# Patient Record
Sex: Female | Born: 1980 | Race: White | Hispanic: No | Smoking: Current every day smoker
Health system: Southern US, Community
[De-identification: ages and names within clinical notes are randomized; demographics above are authoritative.]

## PROBLEM LIST (undated history)

## (undated) DIAGNOSIS — F32A Depression, unspecified: Secondary | ICD-10-CM

## (undated) DIAGNOSIS — G43909 Migraine, unspecified, not intractable, without status migrainosus: Secondary | ICD-10-CM

## (undated) DIAGNOSIS — M26629 Arthralgia of temporomandibular joint, unspecified side: Secondary | ICD-10-CM

## (undated) DIAGNOSIS — Z973 Presence of spectacles and contact lenses: Secondary | ICD-10-CM

## (undated) DIAGNOSIS — M1990 Unspecified osteoarthritis, unspecified site: Secondary | ICD-10-CM

## (undated) DIAGNOSIS — F329 Major depressive disorder, single episode, unspecified: Secondary | ICD-10-CM

## (undated) DIAGNOSIS — R059 Cough, unspecified: Secondary | ICD-10-CM

## (undated) DIAGNOSIS — F419 Anxiety disorder, unspecified: Secondary | ICD-10-CM

## (undated) HISTORY — DX: Major depressive disorder, single episode, unspecified: F32.9

## (undated) HISTORY — DX: Depression, unspecified: F32.A

## (undated) HISTORY — DX: Anxiety disorder, unspecified: F41.9

## (undated) HISTORY — PX: ABDOMINAL HYSTERECTOMY: SHX81

## (undated) HISTORY — PX: TONSILLECTOMY: SUR1361

---

## 2013-03-19 LAB — HM PAP SMEAR: HM Pap smear: NEGATIVE

## 2013-05-07 LAB — HM HIV SCREENING LAB: HM HIV Screening: NEGATIVE

## 2018-04-23 ENCOUNTER — Encounter: Payer: Self-pay | Admitting: Emergency Medicine

## 2018-04-23 ENCOUNTER — Emergency Department: Payer: Medicaid - Out of State

## 2018-04-23 ENCOUNTER — Emergency Department
Admission: EM | Admit: 2018-04-23 | Discharge: 2018-04-23 | Disposition: A | Discharge status: Home / Self Care | Payer: Medicaid - Out of State | Attending: Emergency Medicine | Admitting: Emergency Medicine

## 2018-04-23 DIAGNOSIS — Y929 Unspecified place or not applicable: Secondary | ICD-10-CM | POA: Insufficient documentation

## 2018-04-23 DIAGNOSIS — Y999 Unspecified external cause status: Secondary | ICD-10-CM | POA: Diagnosis not present

## 2018-04-23 DIAGNOSIS — Y939 Activity, unspecified: Secondary | ICD-10-CM | POA: Diagnosis not present

## 2018-04-23 DIAGNOSIS — M5124 Other intervertebral disc displacement, thoracic region: Secondary | ICD-10-CM

## 2018-04-23 DIAGNOSIS — S29012A Strain of muscle and tendon of back wall of thorax, initial encounter: Secondary | ICD-10-CM | POA: Insufficient documentation

## 2018-04-23 DIAGNOSIS — M546 Pain in thoracic spine: Secondary | ICD-10-CM | POA: Diagnosis present

## 2018-04-23 DIAGNOSIS — X509XXA Other and unspecified overexertion or strenuous movements or postures, initial encounter: Secondary | ICD-10-CM | POA: Insufficient documentation

## 2018-04-23 DIAGNOSIS — S29019A Strain of muscle and tendon of unspecified wall of thorax, initial encounter: Secondary | ICD-10-CM

## 2018-04-23 DIAGNOSIS — M5414 Radiculopathy, thoracic region: Secondary | ICD-10-CM | POA: Diagnosis not present

## 2018-04-23 MED ORDER — PREDNISONE 20 MG PO TABS
60.0000 mg | ORAL_TABLET | Freq: Once | ORAL | Status: AC
  Administered 2018-04-23: 60 mg via ORAL
  Filled 2018-04-23: qty 3

## 2018-04-23 MED ORDER — PREDNISONE 10 MG (21) PO TBPK: ORAL_TABLET | ORAL | 0 refills | Status: DC

## 2018-04-23 MED ORDER — DIAZEPAM 2 MG PO TABS
2.0000 mg | ORAL_TABLET | Freq: Once | ORAL | Status: AC
  Administered 2018-04-23: 2 mg via ORAL
  Filled 2018-04-23: qty 1

## 2018-04-23 MED ORDER — TRAMADOL HCL 50 MG PO TABS
50.0000 mg | ORAL_TABLET | Freq: Once | ORAL | Status: AC
  Administered 2018-04-23: 50 mg via ORAL
  Filled 2018-04-23: qty 1

## 2018-04-23 MED ORDER — METHOCARBAMOL 500 MG PO TABS: 500.0000 mg | ORAL_TABLET | Freq: Four times a day (QID) | ORAL | 0 refills | Status: AC

## 2018-04-23 MED ORDER — TRAMADOL HCL 50 MG PO TABS: 50.0000 mg | ORAL_TABLET | Freq: Three times a day (TID) | ORAL | 0 refills | Status: AC | PRN

## 2018-04-23 NOTE — ED Provider Notes (Signed)
[facility] Emergency Department Provider Note  ____________________________________________  Time seen: Approximately 2:14 PM  I have reviewed the triage vital signs and the nursing notes.   HISTORY  Chief Complaint Back Pain    HPI Veronica Lyons is a 37 y.o. female that presents emergency department for evaluation of mid back pain for 2 weeks.  Patient states that she was carrying some boxes and twisted to the left as a 2-hour ran to her legs and immediately felt pain in her mid back.  She states that pain starts in the middle and radiates to both sides.  She feels like a band is squeezing her back. Pain is worse when she lifts her arms. She has gone to the chiropractor 3 times since incident.  Yesterday the chiropractor was not able to do anything because of her pain.  No IV drug use.  No connective tissue disorders.  The pain does not radiate into her arms.  No numbness or tingling in her hands.  No fever, chills, headache, neck pain, low back pain.  History reviewed. No pertinent past medical history.  There are no active problems to display for this patient.   History reviewed. No pertinent surgical history.  Prior to Admission medications   Not on File    Allergies Patient has no known allergies.  No family history on file.  Social History Social History   Tobacco Use  . Smoking status: Not on file  Substance Use Topics  . Alcohol use: Not on file  . Drug use: Not on file     Review of Systems  Constitutional: No fever/chills ENT: No upper respiratory complaints. Cardiovascular: No chest pain. Respiratory: No SOB. Gastrointestinal: No nausea, no vomiting.  Musculoskeletal: Positive for back pain.  Skin: Negative for rash, abrasions, lacerations, ecchymosis. Neurological: Negative for headaches, numbness or tingling   ____________________________________________   PHYSICAL EXAM:  VITAL SIGNS: ED Triage Vitals  Enc Vitals  Group     BP 04/23/18 1356 112/74     Pulse Rate 04/23/18 1356 93     Resp 04/23/18 1356 16     Temp 04/23/18 1356 97.7 F (36.5 C)     Temp Source 04/23/18 1356 Oral     SpO2 04/23/18 1356 100 %     Weight 04/23/18 1350 130 lb (59 kg)     Height 04/23/18 1350 5\' 4"  (1.626 m)     Head Circumference --      Peak Flow --      Pain Score 04/23/18 1350 8     Pain Loc --      Pain Edu? --      Excl. in GC? --      Constitutional: Alert and oriented. Well appearing and in no acute distress. Eyes: Conjunctivae are normal. PERRL. EOMI. Head: Atraumatic. ENT:      Ears:      Nose: No congestion/rhinnorhea.      Mouth/Throat: Mucous membranes are moist.  Neck: No stridor.  Cardiovascular: Normal rate, regular rhythm.  Good peripheral circulation. Respiratory: Normal respiratory effort without tachypnea or retractions. Lungs CTAB. Good air entry to the bases with no decreased or absent breath sounds. Musculoskeletal: Full range of motion to all extremities. No gross deformities appreciated. Positive for back pain. Tenderness to palpation over inferior thoracic spine and thoracic paraspinal muscles. Pain elicited with abduction of bilateral arms. Neurologic:  Normal speech and language. No gross focal neurologic deficits are appreciated.  Skin:  Skin is warm, dry  and intact. No rash noted. Psychiatric: Mood and affect are normal. Speech and behavior are normal. Patient exhibits appropriate insight and judgement.   ____________________________________________   LABS (all labs ordered are listed, but only abnormal results are displayed)  Labs Reviewed - No data to display ____________________________________________  EKG   ____________________________________________  RADIOLOGY   No results found.  ____________________________________________    PROCEDURES  Procedure(s) performed:    Procedures    Medications  diazepam (VALIUM) tablet 2 mg (2 mg Oral Given  04/23/18 1510)     ____________________________________________   INITIAL IMPRESSION / ASSESSMENT AND PLAN / ED COURSE  Pertinent labs & imaging results that were available during my care of the patient were reviewed by me and considered in my medical decision making (see chart for details).  Review of the [place] CSRS was performed in accordance of the [facility] prior to dispensing any controlled drugs.   Patient presented to the emergency department for evaluation of mid back pain for 2 weeks.  Pain is likely caused by muscle spasms.  Case was discussed with Dr. Sharma Covert.  MRI was ordered given duration of symptoms.  Patient will be transferred to [facility] for care with plan of discharge and muscle relaxers.    ____________________________________________  FINAL CLINICAL IMPRESSION(S) / ED DIAGNOSES  Final diagnoses:  None      NEW MEDICATIONS STARTED DURING THIS VISIT:  ED Discharge Orders    None          This chart was dictated using voice recognition software/Dragon. Despite best efforts to proofread, errors can occur which can change the meaning. Any change was purely unintentional.    Enid Derry, PA-C 04/23/18 1532    Rockne Menghini, MD 04/23/18 1534

## 2018-04-23 NOTE — ED Triage Notes (Signed)
Pt reports 2 weeks ago was lifting heavy boxes and pulled a muscle in her upper back.

## 2018-04-23 NOTE — Discharge Instructions (Signed)
Your exam and MRI of the thoracic spine shows muscle spasms and some radiculopathy chest wall pain. Take the prescription meds as directed. Follow-up with ortho or the urgent care clinic, as needed.

## 2018-04-23 NOTE — ED Provider Notes (Signed)
-----------------------------------------   6:37 PM on 04/23/2018 -----------------------------------------  Blood pressure 120/70, pulse 80, temperature 97.7 F (36.5 C), temperature source Oral, resp. rate 16, height 5\' 4"  (1.626 m), weight 59 kg, SpO2 100 %.  Assuming care from Enid Derry, PA-C.  In short, Veronica Lyons is a 38 y.o. female with a chief complaint of Back Pain .  Refer to the original H&P for additional details.  The current plan of care is to await thoracic MRI results and disposition accordingly. ___________________________________________________________________  RADIOLOGY  Thoracic Spine MRI  IMPRESSION: Small right paracentral disc protrusions at T7-8 and T8-9 mildly indenting the cord. _________________________________________________________________  PROCEDURES  Valium 2 mg PO Prednisone 60 mg PO Tramadol 50 mg PO _________________________________________________________________  DISPOSITION   Patient with ED evaluation of a 2-week complaint of chest wall pain s/p mechanical injury. She noted increased muscle spasms and referral of pain to the left anterior chest wall after visiting a chiropractor. She has right paracentral disc protrusions at T7-8 and T8-9, consistent with her radicular symptoms. She is discharged with prescriptions for Tramadol, prednisone, and Robaxin. She is referred to ortho for further management.      Lissa Hoard, PA-C 04/23/18 1909    Willy Eddy, MD 04/23/18 [phone]

## 2018-04-23 NOTE — ED Notes (Signed)
Se triage note  States she twisted her back about 2 weeks ago  States she was lifting heavy boxes up the stairs  And a dog went between her legs    States pain is mainly in mid back  Has been using aleve.ice and going to chiropractor  States pain became more intense yesterday

## 2018-05-11 ENCOUNTER — Emergency Department: Payer: Medicaid - Out of State

## 2018-05-11 ENCOUNTER — Other Ambulatory Visit: Payer: Self-pay

## 2018-05-11 ENCOUNTER — Emergency Department
Admission: EM | Admit: 2018-05-11 | Discharge: 2018-05-11 | Disposition: A | Discharge status: Home / Self Care | Payer: Medicaid - Out of State | Attending: Emergency Medicine | Admitting: Emergency Medicine

## 2018-05-11 DIAGNOSIS — F17200 Nicotine dependence, unspecified, uncomplicated: Secondary | ICD-10-CM | POA: Insufficient documentation

## 2018-05-11 DIAGNOSIS — M546 Pain in thoracic spine: Secondary | ICD-10-CM | POA: Diagnosis not present

## 2018-05-11 LAB — BASIC METABOLIC PANEL
ANION GAP: 9 (ref 5–15)
BUN: 20 mg/dL (ref 6–20)
CHLORIDE: 107 mmol/L (ref 98–111)
CO2: 26 mmol/L (ref 22–32)
Calcium: 10.1 mg/dL (ref 8.9–10.3)
Creatinine, Ser: 0.95 mg/dL (ref 0.44–1.00)
GFR calc non Af Amer: >60 mL/min (ref >60)
Glucose, Bld: 102 mg/dL — ABNORMAL HIGH (ref 70–99)
POTASSIUM: 5.5 mmol/L — AB (ref 3.5–5.1)
Sodium: 142 mmol/L (ref 135–145)

## 2018-05-11 LAB — CBC WITH DIFFERENTIAL/PLATELET
ABS IMMATURE GRANULOCYTES: 0.04 10*3/uL (ref 0.00–0.07)
BASOS ABS: 0.1 10*3/uL (ref 0.0–0.1)
Basophils Relative: 1 %
Eosinophils Absolute: 0.1 10*3/uL (ref 0.0–0.5)
Eosinophils Relative: 1 %
HCT: 47 % — ABNORMAL HIGH (ref 36.0–46.0)
HEMOGLOBIN: 15.4 g/dL — AB (ref 12.0–15.0)
IMMATURE GRANULOCYTES: 0 %
LYMPHS ABS: 2.5 10*3/uL (ref 0.7–4.0)
LYMPHS PCT: 24 %
MCH: 27.8 pg (ref 26.0–34.0)
MCHC: 32.8 g/dL (ref 30.0–36.0)
MCV: 84.8 fL (ref 80.0–100.0)
Monocytes Absolute: 0.6 10*3/uL (ref 0.1–1.0)
Monocytes Relative: 5 %
NEUTROS PCT: 69 %
NRBC: 0 % (ref 0.0–0.2)
Neutro Abs: 7.2 10*3/uL (ref 1.7–7.7)
Platelets: 288 10*3/uL (ref 150–400)
RBC: 5.54 MIL/uL — ABNORMAL HIGH (ref 3.87–5.11)
RDW: 14.9 % (ref 11.5–15.5)
WBC: 10.4 10*3/uL (ref 4.0–10.5)

## 2018-05-11 LAB — TROPONIN I: Troponin I: <0.03 ng/mL (ref <0.03)

## 2018-05-11 MED ORDER — PREDNISONE 10 MG (48) PO TBPK: ORAL_TABLET | ORAL | 0 refills | Status: DC

## 2018-05-11 MED ORDER — DIAZEPAM 5 MG PO TABS
10.0000 mg | ORAL_TABLET | Freq: Once | ORAL | Status: AC
  Administered 2018-05-11: 10 mg via ORAL
  Filled 2018-05-11: qty 2

## 2018-05-11 MED ORDER — DIAZEPAM 5 MG PO TABS: 5.0000 mg | ORAL_TABLET | Freq: Three times a day (TID) | ORAL | 0 refills | Status: DC | PRN

## 2018-05-11 MED ORDER — TRAMADOL HCL 50 MG PO TABS
50.0000 mg | ORAL_TABLET | Freq: Once | ORAL | Status: AC
  Administered 2018-05-11: 50 mg via ORAL
  Filled 2018-05-11: qty 1

## 2018-05-11 NOTE — ED Triage Notes (Addendum)
In October had MRI that showed thoracic vertebrae (T7-T9) that are bulging but not herniated. Took a week of medication. Since then taking ibuprofen and aleeve but having worse pain back and tingling in hands. States pain is at bra line from back to front. Denies urinary incontinence. [place] up with specialist here. A&O, ambulatory.

## 2018-05-11 NOTE — ED Notes (Signed)
Pt c/o back pain states she was here in October and was dx with herniated discs. Pt was given prednisone and a muscle relaxer. Pt states while the meds lasted she still had pain but it took the edge off. Since finshing meds pain is worse pt states its hard for her to take a breath because of the pain

## 2018-05-11 NOTE — ED Provider Notes (Signed)
[facility] Emergency Department Provider Note       Time seen: ----------------------------------------- 3:37 PM on 05/11/2018 -----------------------------------------   I have reviewed the triage vital signs and the nursing notes.  HISTORY   Chief Complaint Back Pain    HPI Veronica Lyons is a 37 y.o. female with a history of severe dysmenorrhea, Asherman syndrome and previous hysterectomy who presents to the ED for back pain.  Patient states she had an MRI in October of this year that showed thoracic vertebrae that were bulging but not significantly herniated.  She took a week of medication but since then she is having worse back pain and tingling in her hands.  She states pain is in the bra line from the back to the front.  She denies IV drug abuse, fever or new injury.  History reviewed. No pertinent past medical history.  There are no active problems to display for this patient.   History reviewed. No pertinent surgical history.  Allergies Toradol [ketorolac tromethamine]  Social History Social History   Tobacco Use  . Smoking status: Current Every Day Smoker  Substance Use Topics  . Alcohol use: Not Currently  . Drug use: Not on file   Review of Systems Constitutional: Negative for fever. Cardiovascular: Positive for chest tightness Respiratory: Negative for shortness of breath. Gastrointestinal: Negative for abdominal pain, vomiting and diarrhea. Musculoskeletal: Positive for back pain Skin: Negative for rash. Neurological: Negative for headaches, positive for paresthesias of the fingertips  All systems negative/normal/unremarkable except as stated in the HPI  ____________________________________________   PHYSICAL EXAM:  VITAL SIGNS: ED Triage Vitals  Enc Vitals Group     BP 05/11/18 1353 105/74     Pulse Rate 05/11/18 1353 (!) 102     Resp 05/11/18 1353 18     Temp 05/11/18 1353 98.1 F (36.7 C)     Temp Source 05/11/18  1353 Oral     SpO2 05/11/18 1353 98 %     Weight 05/11/18 1353 130 lb (59 kg)     Height 05/11/18 1353 5\' 4"  (1.626 m)     Head Circumference --      Peak Flow --      Pain Score 05/11/18 1402 8     Pain Loc --      Pain Edu? --      Excl. in GC? --    Constitutional: Alert and oriented. Well appearing and in no distress. Cardiovascular: Normal rate, regular rhythm. No murmurs, rubs, or gallops. Respiratory: Normal respiratory effort without tachypnea nor retractions. Breath sounds are clear and equal bilaterally. No wheezes/rales/rhonchi. Musculoskeletal: Nontender with normal range of motion in extremities. No lower extremity tenderness nor edema.  Exquisite tenderness along the paraspinous musculatures of the thoracic spine Neurologic:  Normal speech and language. No gross focal neurologic deficits are appreciated.  Paresthesias are noted in the fingertips, normal strength but strength testing of the upper extremities elicits pain in the back Skin:  Skin is warm, dry and intact. No rash noted. Psychiatric: Mood and affect are normal. Speech and behavior are normal.  ____________________________________________  EKG: Interpreted by me.  Sinus rhythm rate 89 bpm, normal PR interval, normal QRS, normal QT  Labs Reviewed  CBC WITH DIFFERENTIAL/PLATELET - Abnormal; Notable for the following components:      Result Value   RBC 5.54 (*)    Hemoglobin 15.4 (*)    HCT 47.0 (*)    All other components within normal limits  BASIC METABOLIC  PANEL - Abnormal; Notable for the following components:   Potassium 5.5 (*)    Glucose, Bld 102 (*)    All other components within normal limits  TROPONIN I    ____________________________________________  ED COURSE:  As part of my medical decision making, I reviewed the following data within the electronic MEDICAL RECORD NUMBER History obtained from family if available, nursing notes, old chart and ekg, as well as notes from prior ED visits. Patient  presented for persistent back pain with some chest tightness, we will assess with imaging as indicated at this time.   Procedures ____________________________________________   RADIOLOGY Images were viewed by me  Chest x-ray IMPRESSION: No active cardiopulmonary disease.  ____________________________________________  DIFFERENTIAL DIAGNOSIS   Musculoskeletal pain, bulging disc, GERD, unstable angina, spasm  FINAL ASSESSMENT AND PLAN  Back pain   Plan: The patient had presented for persistent back pain. Patient's labs are grossly unremarkable although I am not sure why her potassium was slightly elevated. Patient's imaging was negative.  We will try a longer steroid taper as well as Valium as a muscle relaxant.  She will be referred to orthopedics for outpatient follow-up.   Ulice Dash, MD   Note: This note was generated in part or whole with voice recognition software. Voice recognition is usually quite accurate but there are transcription errors that can and very often do occur. I apologize for any typographical errors that were not detected and corrected.     Emily Filbert, MD 05/11/18 [phone]

## 2018-05-11 NOTE — ED Notes (Addendum)
Spoke to Dr. Fanny Bien about pt presentation, verbal order for EKG. NO blood work at this time.

## 2018-05-11 NOTE — ED Notes (Signed)
ED Provider at bedside. 

## 2018-08-01 DIAGNOSIS — M546 Pain in thoracic spine: Secondary | ICD-10-CM | POA: Diagnosis not present

## 2018-08-02 DIAGNOSIS — M546 Pain in thoracic spine: Secondary | ICD-10-CM | POA: Diagnosis not present

## 2018-08-14 DIAGNOSIS — M5414 Radiculopathy, thoracic region: Secondary | ICD-10-CM | POA: Diagnosis not present

## 2018-08-20 DIAGNOSIS — M5414 Radiculopathy, thoracic region: Secondary | ICD-10-CM | POA: Diagnosis not present

## 2018-09-10 ENCOUNTER — Other Ambulatory Visit: Payer: Self-pay

## 2018-09-10 ENCOUNTER — Ambulatory Visit (INDEPENDENT_AMBULATORY_CARE_PROVIDER_SITE_OTHER): Payer: Medicaid Other | Admitting: Family Medicine

## 2018-09-10 ENCOUNTER — Encounter: Payer: Self-pay | Admitting: Family Medicine

## 2018-09-10 VITALS — BP 115/81 | HR 83 | Temp 98.6°F | Resp 16 | Ht 64.0 in | Wt 138.0 lb

## 2018-09-10 DIAGNOSIS — M4722 Other spondylosis with radiculopathy, cervical region: Secondary | ICD-10-CM | POA: Diagnosis not present

## 2018-09-10 DIAGNOSIS — Z7689 Persons encountering health services in other specified circumstances: Secondary | ICD-10-CM

## 2018-09-10 DIAGNOSIS — F4310 Post-traumatic stress disorder, unspecified: Secondary | ICD-10-CM | POA: Insufficient documentation

## 2018-09-10 MED ORDER — VENLAFAXINE HCL 37.5 MG PO TABS: 37.5000 mg | ORAL_TABLET | Freq: Two times a day (BID) | ORAL | 2 refills | Status: DC

## 2018-09-10 NOTE — Patient Instructions (Addendum)
Thank you for coming to the office today.  Continue Gabapentin - I agree with adjusting dose, most likely approaching 200 to 300mg  3 times a day is reasonable.  Start new med - Venlafaxine (generic Effexor) - twice a day - 37.5mg  tablet - may take few weeks to full effect, can increase dose in future - would have to careful about interaction with tramadol at that point.  Also keep in mind that any mood medication can potentially rarely cause you to have thoughts of harming yourself - if this does happen and you do not feel safe on your medication then need to notify us or seek help immediately.  If thoughts of harming yourself or others, or any significant concern about your safety, please call for help immediately:  [facility] 24 Hour Help Line [phone] or [phone] - Suicide prevention hotline [phone]  - 911  ---------------------------------------------------   PSYCHIATRY / THERAPY-[place]  Internal Referral [facility] - [facility]) Address: [address], [place], [place] [address] Hours: 8:30AM-5PM Phone: [phone]   [facility] [facility] (All ages) [address] [place], [id number] Phone: [phone] (Option 1) [email or web address]  Also consider [facility] as an option -----------------------------------------------------------------------------  SELF REFERRAL - WALK IN OPTIONS  [facility]) [place] [address] [place], [place] [address] Phone: [phone]  [employer], available walk-in 9am-4pm M-F [address] [place], [place] [address] Hours: 9am - 4pm (M-F, walk in available) [phone]  ----------------------------------------------------------------- PSYCHOLOGY COUNSELING ONLY  [facility]  Delight Ovens, LCSW [address] [place], [place] [address] Main Line:  [phone]  [facility]   Address: [address] [place], [place], [place] [address] Hours: Open today  9AM-7PM Phone: [phone]  [facility] [address] [facility]  [facility] Address: [address], [place], [place] [address] Phone: [phone]   Please schedule a Follow-up Appointment to: Return in about 6 weeks (around 10/22/2018) for PTSD follow-up med adjust.  If you have any other questions or concerns, please feel free to call the office or send a message through MyChart. You may also schedule an earlier appointment if necessary.  Additionally, you may be receiving a survey about your experience at our office within a few days to 1 week by e-mail or mail. We value your feedback.  [name], DO [facility], [place]
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
## 2018-09-11 ENCOUNTER — Encounter: Payer: Self-pay | Admitting: Family Medicine

## 2018-09-11 DIAGNOSIS — M4722 Other spondylosis with radiculopathy, cervical region: Secondary | ICD-10-CM | POA: Insufficient documentation
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
## 2018-09-11 NOTE — Assessment & Plan Note (Addendum)
Followed by Corinna Lines - request record Prior injections Now improved on med management Tramadol and Gabapentin - advised likely would benefit from titrate up gabapentin and down tramadol, only PRN

## 2018-09-16 DIAGNOSIS — H52223 Regular astigmatism, bilateral: Secondary | ICD-10-CM | POA: Diagnosis not present

## 2018-09-16 DIAGNOSIS — H5213 Myopia, bilateral: Secondary | ICD-10-CM | POA: Diagnosis not present

## 2018-10-15 DIAGNOSIS — M545 Low back pain: Secondary | ICD-10-CM | POA: Diagnosis not present

## 2018-10-15 DIAGNOSIS — M5414 Radiculopathy, thoracic region: Secondary | ICD-10-CM | POA: Diagnosis not present

## 2018-10-22 ENCOUNTER — Encounter: Payer: Self-pay | Admitting: Family Medicine

## 2018-10-22 ENCOUNTER — Ambulatory Visit (INDEPENDENT_AMBULATORY_CARE_PROVIDER_SITE_OTHER): Payer: Medicaid Other | Admitting: Family Medicine

## 2018-10-22 ENCOUNTER — Other Ambulatory Visit: Payer: Self-pay

## 2018-10-22 DIAGNOSIS — F4310 Post-traumatic stress disorder, unspecified: Secondary | ICD-10-CM | POA: Diagnosis not present

## 2018-10-22 DIAGNOSIS — G4701 Insomnia due to medical condition: Secondary | ICD-10-CM

## 2018-10-22 MED ORDER — VENLAFAXINE HCL 75 MG PO TABS: 75.0000 mg | ORAL_TABLET | Freq: Two times a day (BID) | ORAL | 5 refills | Status: DC

## 2018-10-22 NOTE — Assessment & Plan Note (Signed)
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

## 2018-10-22 NOTE — Progress Notes (Signed)
Subjective:    Patient ID: Veronica Lyons, female    DOB: Sep 28, 1980, 38 y.o.   MRN: [id number]  Veronica Lyons is a 38 y.o. female presenting on 10/22/2018 for Post-Traumatic Stress Disorder  Virtual / Telehealth Encounter - Video Visit via [email or web address] The purpose of this virtual visit is to provide medical care while limiting exposure to the novel coronavirus (COVID19) for both patient and office staff.  Consent was obtained for remote visit:  Yes.   Answered questions that patient had about telehealth interaction:  Yes.   I discussed the limitations, risks, security and privacy concerns of performing an evaluation and management service by video/telephone. I also discussed with the patient that there may be a patient responsible charge related to this service. The patient expressed understanding and agreed to proceed.  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 - Interval update with significant improvement overall for both mood and anxiety on medication - Today patient reports no new concerns. She is pleased with progress so far and thinks we are on the right track to improving her symptoms. She feels overall greater than 50% improved now. She admits that she felt improved after about 2 weeks on the medication, and this was about same time onset of COVID19 pandemic worsening, and she thinks that if she was not on medicine her mood/anxiety would have been much worse and harder to handle. - She describes still some episodic spells, with some days worse mood compared to others, and some days with anxiety worse compared to others, but on average these are both improved, see PHQ GAD  score below for ROS Current medications - Continues to take Venlafaxine IR 37.5mg  BID -  she is tolerating well except some mild stomach upset with constipation and GI upset, now changed diet to Carnivore, and has not added fiber supplement or other - Also continues to take Gabapentin 300mg  BID now, from orthopedic for cervica/thoracic radiculitis, and helps her mood and sleep. Also per orthopedics taking Tramadol 50mg  very RARELY now only 1 pill every few days to weeks, only has 15 pills per rx, only uses if overactivity pain or strain - Admits some still persistent insomnia, now no problem with sleep onset, able to fall asleep, but endorses persistent issues with sleep maintenance overnight will wake up overnight at times difficulty falling back asleep, has tried OTC Benadryl PRN some relief, and tried Melatonin recently - She is doing well currently in her current relationship and with her family - Best friend and sister is back in [place], primary support system - Denies any further panic attacks, suicidal ideation, active dyspnea, chest pain, panic, sweating, nausea vomiting  Depression screen [facility] 2/9 10/22/2018 09/10/2018  Decreased Interest 1 1  Down, Depressed, Hopeless 1 3  PHQ - 2 Score 2 4  Altered sleeping 3 2  Tired, decreased energy 1 3  Change in appetite 1 3  Feeling bad or failure about yourself  2 3  Trouble concentrating 2 1  Moving slowly or fidgety/restless 1 3  Suicidal thoughts 0 3  PHQ-9 Score 12 22  Difficult doing work/chores Somewhat difficult Very difficult   GAD 7 : Generalized Anxiety Score 10/22/2018 09/10/2018  Nervous, Anxious, on  Edge 1 3  Control/stop worrying 1 3  Worry too much - different things 1 3  Trouble relaxing 1 3  Restless 1 3  Easily annoyed or irritable 2 3  Afraid - awful might happen 2 3  Total GAD 7 Score 9 21  Anxiety Difficulty Somewhat difficult Very difficult     Social History   Tobacco Use  . Smoking status: Current Every Day  Smoker  . Smokeless tobacco: Current User  Substance Use Topics  . Alcohol use: Yes  . Drug use: Never    Review of Systems Per HPI unless specifically indicated above     Objective:    There were no vitals taken for this visit.  Wt Readings from Last 3 Encounters:  09/10/18 138 lb (62.6 kg)  05/11/18 130 lb (59 kg)  04/23/18 130 lb (59 kg)    Physical Exam  Note examination was completely remotely via video observation objective data only  Gen - well-appearing, no acute distress or apparent pain, comfortable, pleasant HEENT - eyes appear clear without discharge or redness Heart/Lungs - cannot examine virtually - observed no evidence of coughing or labored breathing. 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 underlying Sleep maintenance insomnia primarily, not problem for sleep onset  See A&P Possibly inc dose venlafaxine will help improve sleep maintenance Also advised maybe ask ortho about a PRN dose of gabapentin if wake up at night, or higher PM dose Continue melatonin, behavioral techniques Follow-up - if not improved sooner within weeks - otherwise discuss again in 3 months      PTSD (post-traumatic stress disorder) - Primary    Significantly improved in past 6 weeks on Venlafaxine for chronic PTSD w/ comorbid depression/anxiety See prior note for background info Secondary sleep maintenance insomnia as well, underlying issue to mental health and may be MSK - Prior failed treatment years ago SSRI Sertraline, Wellbutrin - No local Psychiatry or therapist - Past history of suicidal ideation, prior attempt years ago - now seems improved, denies any active ideation  or intent currently  Plan: 1. Increase venlafaxine from 37.5mg  BID up to 75mg  BID - new rx sent, advised she could take PM dose slightly earlier to avoid impact on keeping her awake potentially - Continue her Gabapentin per Orthopedics - 300 BID - Keep limiting her tramadol to 50mg  PRN 1 per day only if needs, up to max 15 pills per rx per ortho, very rarely taking, unlikely to cause any interaction since very rarely ever takes  Previously, detailed handout given for self referral or office referral options for Psychiatry / mental health provider therapist - pending further review  Previously given, safety resources given contact # for history of suicidal  Follow-up 3 months PTSD, med adjust, GAD7/PHQ9 - sooner if needed      Relevant Medications   venlafaxine (EFFEXOR) 75 MG tablet        Meds ordered this encounter  Medications  . venlafaxine (EFFEXOR) 75 MG tablet    Sig: Take 1 tablet (75 mg total) by mouth 2 (two) times daily.    Dispense:  60 tablet    Refill:  5    Increased dose from 37.5mg  up to 75     Follow-up: - Return in 3 months for PTSD, Insomnia, Med adjust  Patient verbalizes understanding with the above medical recommendations including the limitation of remote medical advice.  Specific follow-up and call-back criteria were given for patient to follow-up or seek medical care more urgently if needed.  Total duration of direct patient care provided via video conference: 15 minutes   [name], DO [facility] 10/22/2018, 2:52 PM

## 2018-10-22 NOTE — Patient Instructions (Addendum)
AVS instructions given over phone. She does not have MyChart access.  Please schedule a Follow-up Appointment to: Return in about 3 months (around 01/21/2019) for PTSD, Insomnia, Med adjust.  [name], DO [facility]

## 2018-10-22 NOTE — Assessment & Plan Note (Signed)
Significantly improved in past 6 weeks on Venlafaxine for chronic PTSD w/ comorbid depression/anxiety See prior note for background info Secondary sleep maintenance insomnia as well, underlying issue to mental health and may be MSK - Prior failed treatment years ago SSRI Sertraline, Wellbutrin - No local Psychiatry or therapist - Past history of suicidal ideation, prior attempt years ago - now seems improved, denies any active ideation or intent currently  Plan: 1. Increase venlafaxine from 37.5mg  BID up to 75mg  BID - new rx sent, advised she could take PM dose slightly earlier to avoid impact on keeping her awake potentially - Continue her Gabapentin per Orthopedics - 300 BID - Keep limiting her tramadol to 50mg  PRN 1 per day only if needs, up to max 15 pills per rx per ortho, very rarely taking, unlikely to cause any interaction since very rarely ever takes  Previously, detailed handout given for self referral or office referral options for Psychiatry / mental health provider therapist - pending further review  Previously given, safety resources given contact # for history of suicidal  Follow-up 3 months PTSD, med adjust, GAD7/PHQ9 - sooner if needed

## 2018-11-11 ENCOUNTER — Telehealth: Payer: Self-pay | Admitting: Family Medicine

## 2018-11-11 NOTE — Telephone Encounter (Signed)
Pt. Called said that she was waking up at 1:00 in the morning was unable to go back to sleep (going on since last visit)  Pt call back # is  [phone]

## 2018-11-11 NOTE — Telephone Encounter (Signed)
Please contact patient to triage her symptoms further, it sounds like this is unchanged from last visit with me 10/22/18, now 3 weeks later - still have same issue with waking up overnight.  Last time we increased Venlafaxine from 37.5 up to 75mg  daily now. Also continued on her existing Gabapentin and very rare Tramadol.  Let me know if she has any other information or - if she has a request on how she would like to treat this problem.  If she wants to discuss other medication rx options - and is ready to make a change - sooner than waiting until July at her next apt, we can schedule her for a brief Virtual Telephone Visit to discuss these available med options.  [name], DO [facility] 11/11/2018, 4:44 PM

## 2018-11-12 ENCOUNTER — Encounter: Payer: Self-pay | Admitting: Family Medicine

## 2018-11-12 ENCOUNTER — Other Ambulatory Visit: Payer: Self-pay

## 2018-11-12 ENCOUNTER — Ambulatory Visit (INDEPENDENT_AMBULATORY_CARE_PROVIDER_SITE_OTHER): Payer: Medicaid Other | Admitting: Family Medicine

## 2018-11-12 DIAGNOSIS — F410 Panic disorder [episodic paroxysmal anxiety] without agoraphobia: Secondary | ICD-10-CM | POA: Diagnosis not present

## 2018-11-12 DIAGNOSIS — G4701 Insomnia due to medical condition: Secondary | ICD-10-CM | POA: Diagnosis not present

## 2018-11-12 DIAGNOSIS — F4310 Post-traumatic stress disorder, unspecified: Secondary | ICD-10-CM

## 2018-11-12 MED ORDER — ZOLPIDEM TARTRATE ER 6.25 MG PO TBCR: 6.2500 mg | EXTENDED_RELEASE_TABLET | Freq: Every evening | ORAL | 1 refills | Status: DC | PRN

## 2018-11-12 MED ORDER — BUSPIRONE HCL 5 MG PO TABS: 5.0000 mg | ORAL_TABLET | Freq: Two times a day (BID) | ORAL | 2 refills | Status: DC

## 2018-11-12 NOTE — Telephone Encounter (Signed)
No vm set up. 

## 2018-11-12 NOTE — Telephone Encounter (Signed)
As per patient she can fall a sleep but hard time staying sleep basically same Sxs as last visit she is not taking Tramadol but have sone other changes and taking gabapentin she would like to discuss other medication options and do not want to wait until July and want sooner appointment scheduled for virtual phone visit today in the afternoon.

## 2018-11-12 NOTE — Progress Notes (Signed)
Virtual Visit via Telephone The purpose of this virtual visit is to provide medical care while limiting exposure to the novel coronavirus (COVID19) for both patient and office staff.  Consent was obtained for phone visit:  Yes.   Answered questions that patient had about telehealth interaction:  Yes.   I discussed the limitations, risks, security and privacy concerns of performing an evaluation and management service by telephone. I also discussed with the patient that there may be a patient responsible charge related to this service. The patient expressed understanding and agreed to proceed.  Patient Location: Home Provider Location: [facility])  ---------------------------------------------------------------------- Chief Complaint  Patient presents with  . Post-Traumatic Stress Disorder    follow up    S: Reviewed CMA documentation. 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 - Interval update with improvement initially 1+ weeks on higher dose, and then now in past 1-2 week with worsening life stressors at home with family and life in general now during coronavirus pandemic quarantine, and affecting her symptoms triggering regular panic attacks during the day and waking up overnight with insomnia unable to maintain sleep and it is worsening her anxiety and panic next day. - Today patient reports requesting further medication assistance for sleep insomnia and for panic. She describes acute onset of panic with hairs standing up, breathing fast, cannot catch breath, prickly tingling feeling, nausea, lasting for few hours, only relief is going somewhere alone and resting and breathing through, and self  calming techniques, temporary relief but still some shaking symptoms even when breathing improves.  Currently taking Venlafaxine  BID with good results, but not significant enough to control symptoms currently due to recent changes in life stressors  - Previously Buspar in past  BID - ineffective took for 2 weeks - does not recall but interested to retry this medication Prior failed treatment years ago SSRI Sertraline, Wellbutrin, thinks she has taken Trazodone before Successful treatment in past - Ambien CR - used this in past in hospital, able to rest better. Did not endorse a hang over effect. - She is doing well currently in her current relationship and with her family - Best friend and sister is back in [place], primary support system  Denies any high risk travel to areas of current concern for COVID19. Denies any known or suspected exposure to person with or possibly with COVID19.   Past Medical History:  Diagnosis Date  . Anxiety   . Depression    Social History   Tobacco Use  . Smoking status: Current Every Day Smoker  . Smokeless tobacco: Current User  Substance Use Topics  . Alcohol use: Yes  . Drug use: Never    Current Outpatient Medications:  .  gabapentin (NEURONTIN) 100 MG capsule, Take 300 mg by mouth 2 (two) times daily. Per orthopedics, Disp: , Rfl:  .  venlafaxine (EFFEXOR) 75 MG tablet, Take 1 tablet (75 mg total) by mouth 2 (two) times daily., Disp: 60 tablet, Rfl: 5 .  busPIRone (BUSPAR) 5 MG tablet, Take 1 tablet (5 mg total) by mouth 2 (two) times daily. Increase up to 7.5mg  (1.5) pills twice a day as tolerated., Disp: 60 tablet, Rfl: 2 .  traMADol (ULTRAM) 50 MG tablet, Take 50 mg  by mouth daily as needed. Rarely take only PRN, limited supply only, Disp: , Rfl:  .  zolpidem (AMBIEN CR) 6.25 MG CR tablet, Take 1 tablet (6.25 mg total) by mouth at bedtime as needed for sleep., Disp: 30 tablet, Rfl: 1  Depression screen [facility] 2/9 11/12/2018 10/22/2018  09/10/2018  Decreased Interest 2 1 1   Down, Depressed, Hopeless 3 1 3   PHQ - 2 Score 5 2 4   Altered sleeping 3 3 2   Tired, decreased energy 3 1 3   Change in appetite 2 1 3   Feeling bad or failure about yourself  3 2 3   Trouble concentrating 1 2 1   Moving slowly or fidgety/restless 2 1 3   Suicidal thoughts 1 0 3  PHQ-9 Score 20 12 22   Difficult doing work/chores Very difficult Somewhat difficult Very difficult   [place]-Suicide Severity Rating Scale See screening in chart. No intent with specific plan No active suicidal behavior.   GAD 7 : Generalized Anxiety Score 11/12/2018 10/22/2018 09/10/2018  Nervous, Anxious, on Edge 3 1 3   Control/stop worrying 3 1 3   Worry too much - different things 3 1 3   Trouble relaxing 3 1 3   Restless 3 1 3   Easily annoyed or irritable 3 2 3   Afraid - awful might happen 2 2 3   Total GAD 7 Score 20 9 21   Anxiety Difficulty Very difficult Somewhat difficult Very difficult    -------------------------------------------------------------------------- O: No physical exam performed due to remote telephone encounter.   No results found for this or any previous visit (from the past 2160 hour(s)).  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 underlying Sleep maintenance insomnia primarily, not problem for sleep onset See A&P  Trial on Ambien CR 6.25mg  nightly PRN      Relevant Medications   zolpidem (AMBIEN CR) 6.25 MG CR tablet   PTSD (post-traumatic stress disorder) - Primary    Significant WORSENING now with temporary improve on higher dose Venlafaxine up to 75 BID but seems recent acute stressors triggering severe panic attacks recurrent and insomnia - chronic PTSD w/ comorbid depression/anxiety See prior note for background info Secondary sleep maintenance  insomnia as well, underlying issue to mental health and may be MSK - Prior failed treatment years ago SSRI Sertraline, Wellbutrin, Trazodone - No local Psychiatry or therapist - still interested but has not arranged - Past history of suicidal ideation, prior attempt years ago - now seems improved, denies any active ideation or intent currently  Plan: - START Buspar 5mg  BID can inc to 7.5mg  BID if needed higher dose, for anxiety panic symptoms, can use regularly vs short term PRN few days to week at a time, or can try even more brief PRN dosing in future - Continue Venlafaxine 75mg  BID - Continue her Gabapentin per Orthopedics - For Insomnia - START Ambien CR 6.25mg  nightly to reset sleep cycle few days then use PRN, prefer to use prior to sleep to help maintain, can use if wake if need, goal for improve sleep should improve mood/anxiety, discussed safety concern, side effects and ideally this is not long term treatment option  Previously, detailed handout given for self referral or office referral options for Psychiatry / mental health provider therapist - pending further review  Previously given, safety resources given contact # for history of suicidal  Follow-up sooner 4-6 week if need otherwise keep apt 2  months PTSD, med adjust, GAD7/PHQ9 - sooner if needed      Relevant Medications   busPIRone (BUSPAR) 5 MG tablet    Other Visit Diagnoses    Panic attacks       Relevant Medications   busPIRone (BUSPAR) 5 MG tablet      Meds ordered this encounter  Medications  . busPIRone (BUSPAR) 5 MG tablet    Sig: Take 1 tablet (5 mg total) by mouth 2 (two) times daily. Increase up to 7.5mg  (1.5) pills twice a day as tolerated.    Dispense:  60 tablet    Refill:  2  . zolpidem (AMBIEN CR) 6.25 MG CR tablet    Sig: Take 1 tablet (6.25 mg total) by mouth at bedtime as needed for sleep.    Dispense:  30 tablet    Refill:  1    Follow-up: - Return in 4-6 weeks for mood/anxiety insomnia,  otherwise if improved 2 months as scheduled  Patient verbalizes understanding with the above medical recommendations including the limitation of remote medical advice.  Specific follow-up and call-back criteria were given for patient to follow-up or seek medical care more urgently if needed.   - Time spent in direct consultation with patient on phone: 18 minutes   Saralyn PilarAlexander Karamalegos, DO [facility] 11/12/2018, 3:33 PM

## 2018-11-13 NOTE — Assessment & Plan Note (Signed)
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

## 2018-11-13 NOTE — Assessment & Plan Note (Signed)
Significant WORSENING now with temporary improve on higher dose Venlafaxine up to 75 BID but seems recent acute stressors triggering severe panic attacks recurrent and insomnia - chronic PTSD w/ comorbid depression/anxiety See prior note for background info Secondary sleep maintenance insomnia as well, underlying issue to mental health and may be MSK - Prior failed treatment years ago SSRI Sertraline, Wellbutrin, Trazodone - No local Psychiatry or therapist - still interested but has not arranged - Past history of suicidal ideation, prior attempt years ago - now seems improved, denies any active ideation or intent currently  Plan: - START Buspar 5mg  BID can inc to 7.5mg  BID if needed higher dose, for anxiety panic symptoms, can use regularly vs short term PRN few days to week at a time, or can try even more brief PRN dosing in future - Continue Venlafaxine 75mg  BID - Continue her Gabapentin per Orthopedics - For Insomnia - START Ambien CR 6.25mg  nightly to reset sleep cycle few days then use PRN, prefer to use prior to sleep to help maintain, can use if wake if need, goal for improve sleep should improve mood/anxiety, discussed safety concern, side effects and ideally this is not long term treatment option  Previously, detailed handout given for self referral or office referral options for Psychiatry / mental health provider therapist - pending further review  Previously given, safety resources given contact # for history of suicidal  Follow-up sooner 4-6 week if need otherwise keep apt 2 months PTSD, med adjust, GAD7/PHQ9 - sooner if needed

## 2018-11-13 NOTE — Progress Notes (Deleted)
Late entry, date of service 11/12/18

## 2018-11-13 NOTE — Patient Instructions (Signed)
AVS given by phone. No MyChart. 

## 2018-11-15 ENCOUNTER — Telehealth: Payer: Self-pay | Admitting: Family Medicine

## 2018-11-15 NOTE — Telephone Encounter (Signed)
Received fax for PA for Zolpidem CR 6.25mg   Suspected it was due to quantity limit, as rx was for #30 pills for 30 days  Spoke with patient 11/14/18, advised she could purchase out of pocket w/ good rx coupon, for now. She did so and now we received response back from her ins plan from our inquiry Medicaid as this is not a preferred med but it is under a quantity limit, they will approve 15 pills for 30 days.  Advised patient, she will try her existing rx for 30 days and notify us before she runs out if she would like re order of 15 pills per rx, if she can take intermittently PRN, or if she needs to take it more regular, we can offer option of two rx one for 15 pills and separate rx that she could pay out of pocket for 15 pills, will have to contact pharmacy prior to this if needed.  [name], DO [facility] 11/15/2018, 4:30 PM

## 2018-11-27 ENCOUNTER — Telehealth: Payer: Self-pay | Admitting: Family Medicine

## 2018-11-27 DIAGNOSIS — F410 Panic disorder [episodic paroxysmal anxiety] without agoraphobia: Secondary | ICD-10-CM

## 2018-11-27 DIAGNOSIS — G4701 Insomnia due to medical condition: Secondary | ICD-10-CM

## 2018-11-27 DIAGNOSIS — F4310 Post-traumatic stress disorder, unspecified: Secondary | ICD-10-CM

## 2018-11-27 MED ORDER — ZOLPIDEM TARTRATE ER 6.25 MG PO TBCR: 6.2500 mg | EXTENDED_RELEASE_TABLET | Freq: Every evening | ORAL | 2 refills | Status: DC | PRN

## 2018-11-27 MED ORDER — BUSPIRONE HCL 7.5 MG PO TABS: 7.5000 mg | ORAL_TABLET | Freq: Two times a day (BID) | ORAL | 2 refills | Status: DC

## 2018-11-27 NOTE — Telephone Encounter (Signed)
Ordered Buspar 7.5 BID and Ambien CR 6.25 #15  [name], DO [facility] 11/27/2018, 2:04 PM

## 2018-11-27 NOTE — Telephone Encounter (Signed)
Pt called requesting refill on Ambien 6.25, buspar  Called into [facility]  [place]

## 2018-11-27 NOTE — Telephone Encounter (Signed)
Can you contact patient to confirm her dose on Buspar?  She was started on Buspar 5mg  one pill twice a day back on 11/12/18.  If needed she could increase it up to 1.5 pills, for 7.5mg  twice a day  Can you clarify if she is taking 5mg  (one pill) or 7.5mg  (one and half) twice a day?  I will send re order for Ambien CR as well, it will be #15 pills, not 30 in order to get insurance approval  [name], DO [facility] 11/27/2018, 1:14 PM

## 2018-11-27 NOTE — Telephone Encounter (Signed)
The pt is currently taking 1.5 pills, for 7.5 MG twice daily.

## 2018-12-17 ENCOUNTER — Other Ambulatory Visit: Payer: Self-pay

## 2018-12-17 DIAGNOSIS — G4701 Insomnia due to medical condition: Secondary | ICD-10-CM

## 2018-12-17 DIAGNOSIS — F4310 Post-traumatic stress disorder, unspecified: Secondary | ICD-10-CM

## 2018-12-17 DIAGNOSIS — F410 Panic disorder [episodic paroxysmal anxiety] without agoraphobia: Secondary | ICD-10-CM

## 2018-12-17 MED ORDER — BUSPIRONE HCL 7.5 MG PO TABS: 7.5000 mg | ORAL_TABLET | Freq: Two times a day (BID) | ORAL | 2 refills | Status: DC

## 2018-12-17 MED ORDER — ZOLPIDEM TARTRATE ER 6.25 MG PO TBCR: 6.2500 mg | EXTENDED_RELEASE_TABLET | Freq: Every evening | ORAL | 2 refills | Status: DC | PRN

## 2018-12-17 NOTE — Telephone Encounter (Signed)
Fixed rx for Buspar, should have been a 30 day supply of BID med for 60 pills, not 30 pills, apologized to patient for incorrect # of pills, only sent 30 pills.  Also re sent zolpidem 15 pills should be covered, she needs to let us know if not covered or problems at pharmacy obtaining meds, we should not have to do PA this time on zolpidem since 15 pills was the requested amount from [place] 12/17/2018, 11:51 AM

## 2018-12-17 NOTE — Telephone Encounter (Signed)
The pt requesting that we change the qty of her Buspar. We sent in a 30 day script, but the patient is currently taking the medication BID.

## 2019-01-15 DIAGNOSIS — M5414 Radiculopathy, thoracic region: Secondary | ICD-10-CM | POA: Diagnosis not present

## 2019-01-15 DIAGNOSIS — M545 Low back pain: Secondary | ICD-10-CM | POA: Diagnosis not present

## 2019-01-21 ENCOUNTER — Encounter: Payer: Self-pay | Admitting: Family Medicine

## 2019-01-21 ENCOUNTER — Other Ambulatory Visit: Payer: Self-pay

## 2019-01-21 ENCOUNTER — Ambulatory Visit (INDEPENDENT_AMBULATORY_CARE_PROVIDER_SITE_OTHER): Payer: Medicaid Other | Admitting: Family Medicine

## 2019-01-21 DIAGNOSIS — G4701 Insomnia due to medical condition: Secondary | ICD-10-CM | POA: Diagnosis not present

## 2019-01-21 DIAGNOSIS — G894 Chronic pain syndrome: Secondary | ICD-10-CM | POA: Diagnosis not present

## 2019-01-21 DIAGNOSIS — F4310 Post-traumatic stress disorder, unspecified: Secondary | ICD-10-CM

## 2019-01-21 DIAGNOSIS — M2550 Pain in unspecified joint: Secondary | ICD-10-CM | POA: Diagnosis not present

## 2019-01-21 DIAGNOSIS — M4722 Other spondylosis with radiculopathy, cervical region: Secondary | ICD-10-CM | POA: Diagnosis not present

## 2019-01-21 DIAGNOSIS — G8929 Other chronic pain: Secondary | ICD-10-CM

## 2019-01-21 MED ORDER — DULOXETINE HCL 30 MG PO CPEP: 30.0000 mg | ORAL_CAPSULE | Freq: Every day | ORAL | 2 refills | Status: DC

## 2019-01-21 NOTE — Assessment & Plan Note (Signed)
Significant improvement on current regimen. Reduced stress. Associated secondary panic attacks, insomnia - chronic PTSD w/ comorbid depression/anxiety See prior note for background info Secondary sleep maintenance insomnia as well, underlying issue to mental health and may be MSK - Prior failed treatment years ago SSRI Sertraline, Wellbutrin, Trazodone - No local Psychiatry or therapist - Past history of suicidal ideation, prior attempt years ago - now seems improved, denies any active ideation or intent currently  Plan: - Discussion on alternative meds, may help her chronic pain - switch SNRI Venlafaxine over to Duloxetine. Taper instructions on Venlafaxine 75mg  BID down to half tabs 37.5mg  BID for 1 week then 37.5mg  daily for 3-7 days then switch to Duloxetine 30mg  daily. New rx sent. - Continue Buspar 7.5mg  BID - Continue her Gabapentin per Orthopedics - advised consider dose increase in future - Continue rare use of Ambien CR 6.25mg  PRN nightly insomnia  Previously, detailed handout given for self referral or office referral options for Psychiatry / mental health provider therapist - pending further review  Previously given, safety resources given contact # for history of suicidal  Follow-up sooner 4-6 week PTSD, med adjust, GAD7/PHQ9 - sooner if needed

## 2019-01-21 NOTE — Progress Notes (Signed)
Virtual Visit via Telephone The purpose of this virtual visit is to provide medical care while limiting exposure to the novel coronavirus (COVID19) for both patient and office staff.  Consent was obtained for phone visit:  Yes.   Answered questions that patient had about telehealth interaction:  Yes.   I discussed the limitations, risks, security and privacy concerns of performing an evaluation and management service by telephone. I also discussed with the patient that there may be a patient responsible charge related to this service. The patient expressed understanding and agreed to proceed.  Patient Location: Home Provider Location: [facility])  ---------------------------------------------------------------------- Chief Complaint  Patient presents with  . Insomnia    S: Reviewed CMA documentation. I have called patient and gathered additional HPI as follows:  FOLLOW-UPPTSD with primarily anxiety/ mixed depressive disorder - Last visit with me virtual visit 11/2018, for same problems, treated with buspar, ambien PRN, on venlafaxine, see prior notes for background information. - Interval update with significant improvement overall in anxiety PTSD and mood - Today patient reports doing well currently - Taking Buspirone 7.67m BID, Venlafaxine 771mBID, has Zolpidem CR 6.2562mightly PRN rarely takes, insurance not even fill 15 pills she pays out of pocket, takes rarely. - Improved sleep, reduced panic - History of migraines, improved - Prior failed Sertraline, Wellbutrin, Trazodone - She is doing well currently in her current relationship and with her family  Chronic Back Pain / Chronic Pain Syndrome Following with [facility], review prior notes for background information. - Interval updates. She continues on Gabapentin 300m54mD, thinks she could benefit from higher dose but ortho not planning to rx at this time. She takes Tramadol PRN only gets short  course from ortho PRN, helps sciatica and restless leg - She wants to avoid pain clinic, they offered to refer, she is interested in other options, prior history of spinal injections and other procedure limited benefit in past - Additional associated issues with chronic pain TMJ flared worse with anxiety, seems related to joint pain and arthritis - Admits increased bruising, random places - Admits randomly can get 1 toe numbness and appears white at times, episodic only  Denies any high risk travel to areas of current concern for COVID19. Denies any known or suspected exposure to person with or possibly with COVID19.   Past Medical History:  Diagnosis Date  . Anxiety   . Depression    Social History   Tobacco Use  . Smoking status: Current Every Day Smoker  . Smokeless tobacco: Current User  Substance Use Topics  . Alcohol use: Yes  . Drug use: Never    Current Outpatient Medications:  .  busPIRone (BUSPAR) 7.5 MG tablet, Take 1 tablet (7.5 mg total) by mouth 2 (two) times daily., Disp: 180 tablet, Rfl: 2 .  gabapentin (NEURONTIN) 100 MG capsule, Take 300 mg by mouth 2 (two) times daily. Per orthopedics, Disp: , Rfl:  .  traMADol (ULTRAM) 50 MG tablet, Take 50 mg by mouth daily as needed. Rarely take only PRN, limited supply only, Disp: , Rfl:  .  venlafaxine (EFFEXOR) 75 MG tablet, Take 1 tablet (75 mg total) by mouth 2 (two) times daily., Disp: 60 tablet, Rfl: 5 .  DULoxetine (CYMBALTA) 30 MG capsule, Take 1 capsule (30 mg total) by mouth daily. Taper off Venlafaxine, Disp: 30 capsule, Rfl: 2 .  zolpidem (AMBIEN CR) 6.25 MG CR tablet, Take 1 tablet (6.25 mg total) by mouth at bedtime as needed  for sleep. (Patient not taking: Reported on 01/21/2019), Disp: 15 tablet, Rfl: 2  Depression screen [facility] 2/9 01/21/2019 11/12/2018 10/22/2018  Decreased Interest _0 Down, Depressed, Hopeless _1 PHQ - 2 Score _2 Altered sleeping _3 Tired, decreased energy _4 Change in  appetite 0 2 1  Feeling bad or failure about yourself  _5 Trouble concentrating 0 1 2  Moving slowly or fidgety/restless _6 Suicidal thoughts 0 1 0  PHQ-9 Score _7 Difficult doing work/chores Not difficult at all Very difficult Somewhat difficult    GAD 7 : Generalized Anxiety Score 01/21/2019 11/12/2018 10/22/2018 09/10/2018  Nervous, Anxious, on Edge _8 Control/stop worrying _9 Worry too much - different things _10 Trouble relaxing _11 Restless _12 Easily annoyed or irritable _13 Afraid - awful might happen _14 Total GAD 7 Score _15 Anxiety Difficulty Somewhat difficult Very difficult Somewhat difficult Very difficult    -------------------------------------------------------------------------- O: No physical exam performed due to remote telephone encounter.  Lab results reviewed.  No results found for this or any previous visit (from the past 2160 hour(s)).  -------------------------------------------------------------------------- A&P:  Problem List Items Addressed This Visit    Insomnia due to medical condition   Relevant Medications   DULoxetine (CYMBALTA) 30 MG capsule   Osteoarthritis of spine with radiculopathy, cervical region   Relevant Medications   DULoxetine (CYMBALTA) 30 MG capsule   Other Relevant Orders   Sed Rate (ESR)   C-reactive protein   ANA   Cyclic citrul peptide antibody, IgG   Rheumatoid Factor   CBC with Differential/Platelet   PTSD (post-traumatic stress disorder) - Primary    Significant improvement on current regimen. Reduced stress. Associated secondary panic attacks, insomnia - chronic PTSD w/ comorbid depression/anxiety See prior note for background info Secondary sleep maintenance insomnia as well, underlying issue to mental health and may be MSK - Prior failed treatment years ago SSRI Sertraline, Wellbutrin, Trazodone - No local Psychiatry or therapist - Past history of  suicidal ideation, prior attempt years ago - now seems improved, denies any active ideation or intent currently  Plan: - Discussion on alternative meds, may help her chronic pain - switch SNRI Venlafaxine over to Duloxetine. Taper instructions on Venlafaxine 57m BID down to half tabs 37.570mBID for 1 week then 37.77m677maily for 3-7 days then switch to Duloxetine 39m20mily. New rx sent. - Continue Buspar 7.77mg 19m - Continue her Gabapentin per Orthopedics - advised consider dose increase in future - Continue rare use of Ambien CR 6.277mg 67mnightly insomnia  Previously, detailed handout given for self referral or office referral options for Psychiatry / mental health provider therapist - pending further review  Previously given, safety resources given contact # for history of suicidal  Follow-up sooner 4-6 week PTSD, med adjust, GAD7/PHQ9 - sooner if needed      Relevant Medications   DULoxetine (CYMBALTA) 30 MG capsule   Other Relevant Orders   CBC with Differential/Platelet   COMPLETE METABOLIC PANEL WITH GFR    Other Visit Diagnoses    Chronic pain syndrome       Relevant Medications   DULoxetine (CYMBALTA) 30 MG capsule   Chronic joint pain  Relevant Medications   DULoxetine (CYMBALTA) 30 MG capsule   Other Relevant Orders   Sed Rate (ESR)   C-reactive protein   ANA   Cyclic citrul peptide antibody, IgG   Rheumatoid Factor     Chronic pain syndrome / joint pain / Arthritis Discussion on clinical diagnosis, some uncertainty possible other joint pain in setting of age 35 female, question fibromyalgia or if mood PTSD related. - Already with [facility], has had extensive treatment in past, including prior injection therapy  Consider rheumatological workup at this point for additional diagnostics, blood panel ordered, return within 1 week to draw lab and f/u - Additionally consider [facility] Pain management, vs PM&R physiatry as option for functional pain  management - Future consider higher dose Gabapentin - Seems to benefit from Tramadol but she is cautious with how often she takes this med  Meds ordered this encounter  Medications  . DULoxetine (CYMBALTA) 30 MG capsule    Sig: Take 1 capsule (30 mg total) by mouth daily. Taper off Venlafaxine    Dispense:  30 capsule    Refill:  2   - Order rheumatology panel ESR CRP ANA RF, anti-CCP, CMET, CBC  Follow-up: - Return in 4-6 weeks lab review, med adjust  Patient verbalizes understanding with the above medical recommendations including the limitation of remote medical advice.  Specific follow-up and call-back criteria were given for patient to follow-up or seek medical care more urgently if needed.   - Time spent in direct consultation with patient on phone: 18 minutes  Nobie Putnam, [facility] 01/21/2019, 1:28 PM

## 2019-01-21 NOTE — Patient Instructions (Addendum)
Medication Taper Off Instructions Current med - Venlafaxine 75 BID taper off to Duloxetine 30mg  daily  Week 1: HALF pill twice a day 37.5mg  dosage twice a day (Half of 75mg ) Week 2: half pill daily 37.5mg  dosage STOP and START new  Info on Dr Letta Pate given today.  DUE for FASTING BLOOD WORK (no food or drink after midnight before the lab appointment, only water or coffee without cream/sugar on the morning of)  SCHEDULE "Lab Only" visit in the morning at the clinic for lab draw in 1-2 weeks  - Make sure Lab Only appointment is at about 1 week before your next appointment, so that results will be available  For Lab Results, once available within 2-3 days of blood draw, you can can log in to MyChart online to view your results and a brief explanation. Also, we can discuss results at next follow-up visit.   Please schedule a Follow-up Appointment to: No follow-ups on file.  If you have any other questions or concerns, please feel free to call the office or send a message through [place]. You may also schedule an earlier appointment if necessary.  Additionally, you may be receiving a survey about your experience at our office within a few days to 1 week by e-mail or mail. We value your feedback.  Veronica Putnam, DO [name]

## 2019-01-29 ENCOUNTER — Other Ambulatory Visit: Payer: Medicaid Other

## 2019-01-29 DIAGNOSIS — G8929 Other chronic pain: Secondary | ICD-10-CM | POA: Diagnosis not present

## 2019-01-29 DIAGNOSIS — F4310 Post-traumatic stress disorder, unspecified: Secondary | ICD-10-CM | POA: Diagnosis not present

## 2019-01-29 DIAGNOSIS — M2550 Pain in unspecified joint: Secondary | ICD-10-CM

## 2019-01-29 DIAGNOSIS — M4722 Other spondylosis with radiculopathy, cervical region: Secondary | ICD-10-CM | POA: Diagnosis not present

## 2019-01-30 LAB — CYCLIC CITRUL PEPTIDE ANTIBODY, IGG: Cyclic Citrullin Peptide Ab: <16 UNITS

## 2019-01-30 LAB — CBC WITH DIFFERENTIAL/PLATELET
Absolute Monocytes: 260 cells/uL (ref 200–950)
Basophils Absolute: 31 cells/uL (ref 0–200)
Basophils Relative: 0.6 %
Eosinophils Absolute: 128 cells/uL (ref 15–500)
Eosinophils Relative: 2.5 %
HCT: 38.1 % (ref 35.0–45.0)
Hemoglobin: 12.2 g/dL (ref 11.7–15.5)
Lymphs Abs: 2111 cells/uL (ref 850–3900)
MCH: 28.5 pg (ref 27.0–33.0)
MCHC: 32 g/dL (ref 32.0–36.0)
MCV: 89 fL (ref 80.0–100.0)
MPV: 9.5 fL (ref 7.5–12.5)
Monocytes Relative: 5.1 %
Neutro Abs: 2570 cells/uL (ref 1500–7800)
Neutrophils Relative %: 50.4 %
Platelets: 233 10*3/uL (ref 140–400)
RBC: 4.28 10*6/uL (ref 3.80–5.10)
RDW: 13 % (ref 11.0–15.0)
Total Lymphocyte: 41.4 %
WBC: 5.1 10*3/uL (ref 3.8–10.8)

## 2019-01-30 LAB — COMPLETE METABOLIC PANEL WITH GFR
AG Ratio: 1.9 (calc) (ref 1.0–2.5)
ALT: 19 U/L (ref 6–29)
AST: 16 U/L (ref 10–30)
Albumin: 4.2 g/dL (ref 3.6–5.1)
Alkaline phosphatase (APISO): 42 U/L (ref 31–125)
BUN: 23 mg/dL (ref 7–25)
CO2: 28 mmol/L (ref 20–32)
Calcium: 9.1 mg/dL (ref 8.6–10.2)
Chloride: 107 mmol/L (ref 98–110)
Creat: 1.07 mg/dL (ref 0.50–1.10)
GFR, Est African American: 78 mL/min/{1.73_m2} (ref >=60)
GFR, Est Non African American: 67 mL/min/{1.73_m2} (ref >=60)
Globulin: 2.2 g/dL (calc) (ref 1.9–3.7)
Glucose, Bld: 82 mg/dL (ref 65–99)
Potassium: 4.4 mmol/L (ref 3.5–5.3)
Sodium: 139 mmol/L (ref 135–146)
Total Bilirubin: 0.4 mg/dL (ref 0.2–1.2)
Total Protein: 6.4 g/dL (ref 6.1–8.1)

## 2019-01-30 LAB — ANA: Anti Nuclear Antibody (ANA): NEGATIVE

## 2019-01-30 LAB — RHEUMATOID FACTOR: Rhuematoid fact SerPl-aCnc: <14 IU/mL (ref <14)

## 2019-01-30 LAB — C-REACTIVE PROTEIN: CRP: 0.6 mg/L (ref <8.0)

## 2019-01-30 LAB — SEDIMENTATION RATE: Sed Rate: 2 mm/h (ref 0–20)

## 2019-02-05 DIAGNOSIS — M545 Low back pain: Secondary | ICD-10-CM | POA: Diagnosis not present

## 2019-02-06 DIAGNOSIS — M546 Pain in thoracic spine: Secondary | ICD-10-CM | POA: Diagnosis not present

## 2019-02-06 DIAGNOSIS — M545 Low back pain: Secondary | ICD-10-CM | POA: Diagnosis not present

## 2019-02-19 DIAGNOSIS — Z79899 Other long term (current) drug therapy: Secondary | ICD-10-CM | POA: Diagnosis not present

## 2019-02-19 DIAGNOSIS — Z5181 Encounter for therapeutic drug level monitoring: Secondary | ICD-10-CM | POA: Diagnosis not present

## 2019-02-21 DIAGNOSIS — M545 Low back pain: Secondary | ICD-10-CM | POA: Diagnosis not present

## 2019-02-21 DIAGNOSIS — M546 Pain in thoracic spine: Secondary | ICD-10-CM | POA: Diagnosis not present

## 2019-02-25 ENCOUNTER — Ambulatory Visit (INDEPENDENT_AMBULATORY_CARE_PROVIDER_SITE_OTHER): Payer: Medicaid Other | Admitting: Family Medicine

## 2019-02-25 ENCOUNTER — Other Ambulatory Visit: Payer: Self-pay

## 2019-02-25 ENCOUNTER — Encounter: Payer: Self-pay | Admitting: Family Medicine

## 2019-02-25 DIAGNOSIS — G4701 Insomnia due to medical condition: Secondary | ICD-10-CM | POA: Diagnosis not present

## 2019-02-25 DIAGNOSIS — F4310 Post-traumatic stress disorder, unspecified: Secondary | ICD-10-CM | POA: Diagnosis not present

## 2019-02-25 DIAGNOSIS — G894 Chronic pain syndrome: Secondary | ICD-10-CM | POA: Diagnosis not present

## 2019-02-25 DIAGNOSIS — M4722 Other spondylosis with radiculopathy, cervical region: Secondary | ICD-10-CM | POA: Diagnosis not present

## 2019-02-25 MED ORDER — ZOLPIDEM TARTRATE ER 6.25 MG PO TBCR: 6.2500 mg | EXTENDED_RELEASE_TABLET | Freq: Every evening | ORAL | 2 refills | Status: DC | PRN

## 2019-02-25 MED ORDER — DULOXETINE HCL 60 MG PO CPEP: 60.0000 mg | ORAL_CAPSULE | Freq: Every day | ORAL | 5 refills | Status: DC

## 2019-02-25 NOTE — Assessment & Plan Note (Signed)
Still improved on current regimen with SNRI now duloxetine instead of venlafaxine Recent life stressors situational w/ COVID / School Improved - Associated secondary panic attacks, insomnia - chronic PTSD w/ comorbid depression/anxiety See prior note for background info Secondary sleep maintenance insomnia as well, underlying issue to mental health and may be MSK - Prior failed treatment years ago SSRI Sertraline, Wellbutrin, Trazodone - No local Psychiatry or therapist - Past history of suicidal ideation, prior attempt years ago - now seems improved, denies any active ideation or intent currently  Plan: OFF Venlafaxine 75 BID INCREASE Duloxetine (Cymbalta) from 30 to 60mg  now daily, new rx sent. Can provide 90 day supply if requested after 4-6 weeks - Continue Buspar 7.5mg  BID - Continue her Gabapentin per Orthopedics - advised consider dose increase in future - Continue intermittent use of Ambien CR 6.25mg  PRN nightly insomnia - RE ORDER today #30 pills since she pays out of pocket, not covered med. Will need refill in 3 months call if need  Previously, detailed handout given for self referral or office referral options for Psychiatry / mental health provider therapist - pending further review  Previously given, safety resources given contact # for history of suicidal  Follow-up sooner 6 months for PTSD, med adjust, GAD7/PHQ9 - sooner if needed

## 2019-02-25 NOTE — Patient Instructions (Signed)
AVS given by phone. 

## 2019-02-25 NOTE — Progress Notes (Signed)
Virtual Visit via Telephone The purpose of this virtual visit is to provide medical care while limiting exposure to the novel coronavirus (COVID19) for both patient and office staff.  Consent was obtained for phone visit:  Yes.   Answered questions that patient had about telehealth interaction:  Yes.   I discussed the limitations, risks, security and privacy concerns of performing an evaluation and management service by telephone. I also discussed with the patient that there may be a patient responsible charge related to this service. The patient expressed understanding and agreed to proceed.  Patient Location: Home Provider Location: [facility])  ---------------------------------------------------------------------- Chief Complaint  Patient presents with  . Post-Traumatic Stress Disorder    follow up    S: Reviewed CMA documentation. I have called patient and gathered additional HPI as follows:  FOLLOW-UPPTSD with primarily anxiety/ mixed depressive disorder - Last visit with me virtual visit 01/2019, for same problems, treated with buspar, ambien CR PRN, transition off venlafaxine over to duloxetine for mood and also pain control, see prior notes for background information. - Interval update with overall improved still but had some difficulty transitioning off venlafaxine and on to duloxetine and other med adjustments and life stressors with COVID / school situation - Today patient reports doing well currently, feels like she is getting back to where she was before the med changes, it has only been 5 weeks - OFF Venlafaxine 75 BID - Taking Duloxetine 40m daily, Buspirone 7.570mBID,  has Zolpidem CR 6.2559mightly PRN she tried some OTC benadryl PRN at night but it made her more groggy, says insurance does not pay for zolpidem at all cannot get it covered, she pays out of pocket - Improved sleep, reduced panic - History of migraines, improved - Prior failed  Sertraline, Wellbutrin, Trazodone. OFF Venlafaxine - She is doing well currently in her current relationship and with her family  Chronic Back Pain / Chronic Pain Syndrome Following with [facility], interval update now transitioned over to [facility] chronic pain management provider to continue Tramadol, since it has been a more longer term issue, also had labs done recently 01/2019 for rheumatoid arthritis and autoimmune screening - results were negative, review prior notes for background information. - Now she is awaiting for further f/u with [facility] Dr ArtRhys [facility] now recently established, did UDS, awaiting further rx Tramadol PRN. - Transition off venlafaxine SNRI now on Duloxetine (Cymbalta) 86m59mily, now more significant situational with anxiety school stuff, PTSD stuff. 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 Denies any known or suspected exposure to person with or possibly with COVID19.   Denies any fevers, chills, sweats, body ache, cough, shortness of breath, sinus pain or pressure, headache, abdominal pain, diarrhea  Past Medical History:  Diagnosis Date  . Anxiety   . Depression    Social History   Tobacco Use  . Smoking status: Current Every Day Smoker  . Smokeless tobacco: Current User  Substance Use Topics  . Alcohol use: Yes  . Drug use: Never    Current Outpatient Medications:  .  busPIRone (BUSPAR) 7.5 MG tablet, Take 1 tablet (7.5 mg total) by mouth  2 (two) times daily., Disp: 180 tablet, Rfl: 2 .  DULoxetine (CYMBALTA) 60 MG capsule, Take 1 capsule (60 mg total) by mouth daily., Disp: 30 capsule, Rfl: 5 .  gabapentin  (NEURONTIN) 300 MG capsule, Take 300 mg by mouth 3 (three) times daily., Disp: , Rfl:  .  traMADol (ULTRAM) 50 MG tablet, Take 50 mg by mouth daily as needed. Rarely take only PRN, limited supply only, Disp: , Rfl:  .  zolpidem (AMBIEN CR) 6.25 MG CR tablet, Take 1 tablet (6.25 mg total) by mouth at bedtime as needed for sleep., Disp: 30 tablet, Rfl: 2 .  methocarbamol (ROBAXIN) 500 MG tablet, methocarbamol 500 mg tablet  TK 1 T PO BID, Disp: , Rfl:   Depression screen [place] 2/9 02/25/2019 01/21/2019 11/12/2018  Decreased Interest _0 Down, Depressed, Hopeless _1 PHQ - 2 Score _2 Altered sleeping _3 Tired, decreased energy _4 Change in appetite 1 0 2  Feeling bad or failure about yourself  _5 Trouble concentrating 1 0 1  Moving slowly or fidgety/restless _6 Suicidal thoughts 0 0 1  PHQ-9 Score _7 Difficult doing work/chores Somewhat difficult Not difficult at all Very difficult    GAD 7 : Generalized Anxiety Score 02/25/2019 01/21/2019 11/12/2018 10/22/2018  Nervous, Anxious, on Edge _8 Control/stop worrying _9 Worry too much - different things _10 Trouble relaxing _11 Restless _12 Easily annoyed or irritable _13 Afraid - awful might happen _14 Total GAD 7 Score _15 Anxiety Difficulty Somewhat difficult Somewhat difficult Very difficult Somewhat difficult    -------------------------------------------------------------------------- O: No physical exam performed due to remote telephone encounter.  Lab results reviewed.  Recent Results (from the past 2160 hour(s))  CBC with Differential/Platelet     Status: None   Collection Time: 01/29/19  8:46 AM  Result Value Ref Range   WBC 5.1 3.8 - 10.8 Thousand/uL   RBC 4.28 3.80 - 5.10 Million/uL   Hemoglobin 12.2 11.7 - 15.5 g/dL   HCT 38.1 35.0 - 45.0 %   MCV 89.0 80.0 - 100.0 fL   MCH 28.5 27.0 - 33.0 pg   MCHC 32.0 32.0 - 36.0 g/dL   RDW 13.0 11.0 - 15.0 %    Platelets 233 140 - 400 Thousand/uL   MPV 9.5 7.5 - 12.5 fL   Neutro Abs 2,570 1,500 - 7,800 cells/uL   Lymphs Abs 2,111 850 - 3,900 cells/uL   Absolute Monocytes 260 200 - 950 cells/uL   Eosinophils Absolute 128 15 - 500 cells/uL   Basophils Absolute 31 0 - 200 cells/uL   Neutrophils Relative % 50.4 %   Total Lymphocyte 41.4 %   Monocytes Relative 5.1 %   Eosinophils Relative 2.5 %   Basophils Relative 0.6 %  COMPLETE METABOLIC PANEL WITH GFR     Status: None   Collection Time: 01/29/19  8:46 AM  Result Value Ref Range   Glucose, Bld 82 65 - 99 mg/dL    Comment: .            Fasting reference interval .    BUN 23 7 - 25 mg/dL   Creat 1.07 0.50 - 1.10 mg/dL   GFR, Est Non African American  67 > OR = 60 mL/min/1.68m   GFR, Est African American 78 > OR = 60 mL/min/1.747m  BUN/Creatinine Ratio NOT APPLICABLE 6 - 22 (calc)   Sodium 139 135 - 146 mmol/L   Potassium 4.4 3.5 - 5.3 mmol/L   Chloride 107 98 - 110 mmol/L   CO2 28 20 - 32 mmol/L   Calcium 9.1 8.6 - 10.2 mg/dL   Total Protein 6.4 6.1 - 8.1 g/dL   Albumin 4.2 3.6 - 5.1 g/dL   Globulin 2.2 1.9 - 3.7 g/dL (calc)   AG Ratio 1.9 1.0 - 2.5 (calc)   Total Bilirubin 0.4 0.2 - 1.2 mg/dL   Alkaline phosphatase (APISO) 42 31 - 125 U/L   AST 16 10 - 30 U/L   ALT 19 6 - 29 U/L  Rheumatoid Factor     Status: None   Collection Time: 01/29/19  8:46 AM  Result Value Ref Range   Rhuematoid fact SerPl-aCnc <1<651<99U/mL  Cyclic citrul peptide antibody, IgG     Status: None   Collection Time: 01/29/19  8:46 AM  Result Value Ref Range   Cyclic Citrullin Peptide Ab <16 UNITS    Comment: Reference Range Negative:            <20 Weak Positive:       20-39 Moderate Positive:   40-59 Strong Positive:     >59 .   ANA     Status: None   Collection Time: 01/29/19  8:46 AM  Result Value Ref Range   Anti Nuclear Antibody (ANA) NEGATIVE NEGATIVE    Comment: ANA IFA is a first line screen for detecting the presence of up to  approximately 150 autoantibodies in various autoimmune diseases. A negative ANA IFA result suggests an ANA-associated autoimmune disease is not present at this time, but is not definitive. If there is high clinical suspicion for Sjogren's syndrome, testing for anti-SS-A/Ro antibody should be considered. Anti-Jo-1 antibody should be considered for clinically suspected inflammatory myopathies. . AC-0: Negative . International Consensus on ANA Patterns ([email or web address]://www.hernandez-[email or web address]/. For additional information, please refer to http://education.[employer].com/faq/FAQ177 (This link is being provided for informational/ educational purposes only.) .   C-reactive protein     Status: None   Collection Time: 01/29/19  8:46 AM  Result Value Ref Range   CRP 0.6 <8.0 mg/L  Sed Rate (ESR)     Status: None   Collection Time: 01/29/19  8:46 AM  Result Value Ref Range   Sed Rate 2 0 - 20 mm/h    -------------------------------------------------------------------------- A&P:  Problem List Items Addressed This Visit    Insomnia due to medical condition    Improved on ambien Secondary insomnia due to both PTSD/mood/anxiety and MSK issues underlying Sleep maintenance insomnia primarily, not problem for sleep onset See A&P   RE ORDER on Ambien CR 6.257mightly PRN #30 pills not covered by ins Stop Benadryl, causing secondary side effect and excess grogginess      Relevant Medications   DULoxetine (CYMBALTA) 60 MG capsule   zolpidem (AMBIEN CR) 6.25 MG CR tablet   Osteoarthritis of spine with radiculopathy, cervical region    Followed by [facility], now transitioned to [facility] chronic pain Dr KalCristy Folksticipating future resume Tramadol per pain management On higher gabapentin 300 TID with good results Anticipate pain relief on Duloxetine 30 now inc to 60        Relevant Medications   methocarbamol (ROBAXIN) 500 MG tablet   gabapentin (NEURONTIN) 300  MG  capsule   DULoxetine (CYMBALTA) 60 MG capsule   zolpidem (AMBIEN CR) 6.25 MG CR tablet   PTSD (post-traumatic stress disorder)    Still improved on current regimen with SNRI now duloxetine instead of venlafaxine Recent life stressors situational w/ COVID / School Improved - Associated secondary panic attacks, insomnia - chronic PTSD w/ comorbid depression/anxiety See prior note for background info Secondary sleep maintenance insomnia as well, underlying issue to mental health and may be MSK - Prior failed treatment years ago SSRI Sertraline, Wellbutrin, Trazodone - No local Psychiatry or therapist - Past history of suicidal ideation, prior attempt years ago - now seems improved, denies any active ideation or intent currently  Plan: OFF Venlafaxine 75 BID INCREASE Duloxetine (Cymbalta) from 30 to 10m now daily, new rx sent. Can provide 90 day supply if requested after 4-6 weeks - Continue Buspar 7.542mBID - Continue her Gabapentin per Orthopedics - advised consider dose increase in future - Continue intermittent use of Ambien CR 6.2571mRN nightly insomnia - RE ORDER today #30 pills since she pays out of pocket, not covered med. Will need refill in 3 months call if need  Previously, detailed handout given for self referral or office referral options for Psychiatry / mental health provider therapist - pending further review  Previously given, safety resources given contact # for history of suicidal  Follow-up sooner 6 months for PTSD, med adjust, GAD7/PHQ9 - sooner if needed      Relevant Medications   DULoxetine (CYMBALTA) 60 MG capsule    Other Visit Diagnoses    Chronic pain syndrome       Relevant Medications   methocarbamol (ROBAXIN) 500 MG tablet   gabapentin (NEURONTIN) 300 MG capsule   DULoxetine (CYMBALTA) 60 MG capsule      Meds ordered this encounter  Medications  . DULoxetine (CYMBALTA) 60 MG capsule    Sig: Take 1 capsule (60 mg total) by mouth daily.     Dispense:  30 capsule    Refill:  5  . zolpidem (AMBIEN CR) 6.25 MG CR tablet    Sig: Take 1 tablet (6.25 mg total) by mouth at bedtime as needed for sleep.    Dispense:  30 tablet    Refill:  2    Follow-up: - Return for 6 month follow-up PTSD Anxiety PHQ / GAD, Pain, med refills  Anticipate annual physical / labs in 7-8 / 2021 w/ labs  Patient verbalizes understanding with the above medical recommendations including the limitation of remote medical advice.  Specific follow-up and call-back criteria were given for patient to follow-up or seek medical care more urgently if needed.   - Time spent in direct consultation with patient on phone: 12 minutes   AleNobie PutnamO [place] 02/25/2019, 3:43 PM

## 2019-02-25 NOTE — Assessment & Plan Note (Signed)
Followed by [facility], now transitioned to [facility] chronic pain Dr Cristy Folks Anticipating future resume Tramadol per pain management On higher gabapentin 300 TID with good results Anticipate pain relief on Duloxetine 30 now inc to 60

## 2019-02-25 NOTE — Assessment & Plan Note (Signed)
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

## 2019-02-27 DIAGNOSIS — M545 Low back pain: Secondary | ICD-10-CM | POA: Diagnosis not present

## 2019-02-27 DIAGNOSIS — M546 Pain in thoracic spine: Secondary | ICD-10-CM | POA: Diagnosis not present

## 2019-03-07 DIAGNOSIS — Z79899 Other long term (current) drug therapy: Secondary | ICD-10-CM | POA: Diagnosis not present

## 2019-03-07 DIAGNOSIS — M546 Pain in thoracic spine: Secondary | ICD-10-CM | POA: Diagnosis not present

## 2019-03-07 DIAGNOSIS — M545 Low back pain: Secondary | ICD-10-CM | POA: Diagnosis not present

## 2019-03-07 DIAGNOSIS — Z5181 Encounter for therapeutic drug level monitoring: Secondary | ICD-10-CM | POA: Diagnosis not present

## 2019-03-19 DIAGNOSIS — M545 Low back pain: Secondary | ICD-10-CM | POA: Diagnosis not present

## 2019-03-19 DIAGNOSIS — M47814 Spondylosis without myelopathy or radiculopathy, thoracic region: Secondary | ICD-10-CM | POA: Diagnosis not present

## 2019-05-14 DIAGNOSIS — M47814 Spondylosis without myelopathy or radiculopathy, thoracic region: Secondary | ICD-10-CM | POA: Diagnosis not present

## 2019-05-14 DIAGNOSIS — M545 Low back pain: Secondary | ICD-10-CM | POA: Diagnosis not present

## 2019-06-10 ENCOUNTER — Ambulatory Visit: Payer: Medicaid Other | Admitting: Family Medicine

## 2019-06-10 ENCOUNTER — Telehealth: Payer: Self-pay | Admitting: Family Medicine

## 2019-06-10 ENCOUNTER — Other Ambulatory Visit: Payer: Self-pay

## 2019-06-10 DIAGNOSIS — B80 Enterobiasis: Secondary | ICD-10-CM

## 2019-06-10 MED ORDER — MEBENDAZOLE 100 MG PO CHEW: CHEWABLE_TABLET | ORAL | 0 refills | Status: DC

## 2019-06-10 NOTE — Telephone Encounter (Signed)
Changed rx - sent it to [facility] due to medicaid ins by patient request  Nobie Putnam, [facility] 06/10/2019, 11:40 AM

## 2019-06-10 NOTE — Addendum Note (Signed)
Addended by: Olin Hauser on: 06/10/2019 11:40 AM   Modules accepted: Orders

## 2019-06-10 NOTE — Telephone Encounter (Signed)
Patient was no show for her virtual visit today 06/10/19. Her significant other was also scheduled for virtual visit, we were able to reach him, she had same problem and requested same medicine, exposed to Pinworm infection from child at family holiday gathering out of state in [place]. She doesn't have symptoms but was exposed, everyone was asked to be treated.  Meds ordered this encounter  Medications  . mebendazole (VERMOX) 100 MG chewable tablet    Sig: Take 1 tablet by mouth (100mg ) for first dose, then repeat 2nd dose in 2 weeks.    Dispense:  2 tablet    Refill:  0   Nobie Putnam, DO [facility] 06/10/2019, 11:29 AM

## 2019-07-29 ENCOUNTER — Other Ambulatory Visit: Payer: Self-pay

## 2019-07-29 ENCOUNTER — Ambulatory Visit (INDEPENDENT_AMBULATORY_CARE_PROVIDER_SITE_OTHER): Payer: Medicaid Other | Admitting: Family Medicine

## 2019-07-29 ENCOUNTER — Telehealth: Payer: Self-pay | Admitting: Family Medicine

## 2019-07-29 ENCOUNTER — Encounter: Payer: Self-pay | Admitting: Family Medicine

## 2019-07-29 DIAGNOSIS — F410 Panic disorder [episodic paroxysmal anxiety] without agoraphobia: Secondary | ICD-10-CM | POA: Diagnosis not present

## 2019-07-29 DIAGNOSIS — G4701 Insomnia due to medical condition: Secondary | ICD-10-CM

## 2019-07-29 DIAGNOSIS — R41840 Attention and concentration deficit: Secondary | ICD-10-CM | POA: Diagnosis not present

## 2019-07-29 DIAGNOSIS — F4310 Post-traumatic stress disorder, unspecified: Secondary | ICD-10-CM

## 2019-07-29 NOTE — Assessment & Plan Note (Signed)
Still improved on current regimen with SNRI - new concern inattention possible ADD affecting her Recent life stressors situational w/ COVID / School Improved - Associated secondary panic attacks, insomnia - chronic PTSD w/ comorbid depression/anxiety See prior note for background info Secondary sleep maintenance insomnia as well, underlying issue to mental health - Prior failed treatment years ago SSRI Sertraline, Wellbutrin, Trazodone - No local Psychiatry or therapist - Past history of suicidal ideation, prior attempt years ago - now seems improved, denies any active ideation or intent currently OFF Venlafaxine  Plan: Continue Duloxetine (Cymbalta) 60mg  daily - Continue Buspar 7.5mg  BID - Continue her Gabapentin per Orthopedics - advised consider dose increase in future - Continue intermittent use of Ambien CR 6.25mg  PRN nightly insomnia  Previously, detailed handout given for self referral or office referral options for Psychiatry / mental health provider therapist - pending further review  Follow-up sooner 3-6 months for PTSD, med adjust, GAD7/PHQ9 - sooner if needed

## 2019-07-29 NOTE — Addendum Note (Signed)
Addended by: Smitty Cords on: 07/29/2019 01:21 PM   Modules accepted: Orders

## 2019-07-29 NOTE — Telephone Encounter (Signed)
Placed referral to [facility] Louann Liv PhD [facility] location  [name], DO [facility] 07/29/2019, 1:21 PM

## 2019-07-29 NOTE — Telephone Encounter (Signed)
Pt called said that [facility]  Need  Last Dr. Notes and mediation list fax to [phone] attn. Dr Roger Shelter

## 2019-07-29 NOTE — Addendum Note (Signed)
Addended by: Smitty Cords on: 07/29/2019 01:18 PM   Modules accepted: Orders

## 2019-07-29 NOTE — Progress Notes (Addendum)
Virtual Visit via Telephone The purpose of this virtual visit is to provide medical care while limiting exposure to the novel coronavirus (COVID19) for both patient and office staff.  Consent was obtained for phone visit:  Yes.   Answered questions that patient had about telehealth interaction:  Yes.   I discussed the limitations, risks, security and privacy concerns of performing an evaluation and management service by telephone. I also discussed with the patient that there may be a patient responsible charge related to this service. The patient expressed understanding and agreed to proceed.  Patient Location: Home Provider Location: [facility])  ---------------------------------------------------------------------- Chief Complaint  Patient presents with  . ADHD    pt concern that she might have ADHD, because she is having  difficulty concentrating     S: Reviewed CMA documentation. I have called patient and gathered additional HPI as follows:  Suspected ADD / Inattention Reports that symptoms of ADD inattention primarily reduced focus without hyperactivity have been present as long as she can remember even in childhood and school but she states she was home schooled and parents did not believe in ADD, also she went to catholic school and was not a topic there as well. She has struggled with juggling multiple tasks and focusing has known PTSD anxiety/mood but asks if there could be something else affecting her focus and energy, she is busy with managing her home with children and seeking job and advancing her degree. 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 No new major changes or concerns today, she continues meds, see above discussion with concern on inattention.  Denies any high risk travel to areas of current concern for COVID19. Denies any known or suspected exposure to person with or possibly with COVID19.  Denies any fevers, chills, sweats, body ache, cough, shortness of breath, sinus pain or pressure, headache, abdominal pain, diarrhea  Past Medical History:  Diagnosis Date  . Anxiety   . Depression    Social History   Tobacco Use  . Smoking status: Current Every Day Smoker    Packs/day: 1.00    Years: 20.00    Pack years: 20.00    Types: Cigarettes  . Smokeless tobacco: Never Used  Substance Use Topics  . Alcohol use: Yes  . Drug use: Never    Current Outpatient Medications:  .  busPIRone (BUSPAR) 7.5 MG tablet, Take 1 tablet (7.5 mg total) by mouth 2 (two) times daily., Disp: 180 tablet, Rfl: 2 .  gabapentin (NEURONTIN) 300 MG capsule, Take 300 mg by mouth 3 (three) times daily., Disp: , Rfl:  .  tiZANidine (ZANAFLEX) 2 MG tablet, Take 2 mg by mouth 2 (two) times daily., Disp: , Rfl:  .  traMADol (ULTRAM) 50 MG tablet, Take 50 mg by mouth 3 (three) times daily. Rarely take only PRN, limited supply only, Disp: , Rfl:  .  DULoxetine (CYMBALTA) 60 MG capsule, Take 1 capsule (  60 mg total) by mouth daily. (Patient not taking: Reported on 07/29/2019), Disp: 30 capsule, Rfl: 5 .  zolpidem (AMBIEN CR) 6.25 MG CR tablet, Take 1 tablet (6.25 mg total) by mouth at bedtime as needed for sleep. (Patient not taking: Reported on 07/29/2019), Disp: 30 tablet, Rfl: 2  Depression screen [facility] 2/9 07/29/2019 02/25/2019 01/21/2019  Decreased Interest 3 2 1   Down, Depressed, Hopeless 2 2 2   PHQ - 2 Score 5 4 3    Altered sleeping 3 3 2   Tired, decreased energy 2 2 1   Change in appetite 2 1 0  Feeling bad or failure about yourself  2 2 1   Trouble concentrating 3 1 0  Moving slowly or fidgety/restless 3 1 1   Suicidal thoughts 1 0 0  PHQ-9 Score 21 14 8   Difficult doing work/chores Extremely dIfficult Somewhat difficult Not difficult at all    GAD 7 : Generalized Anxiety Score 07/29/2019 02/25/2019 01/21/2019 11/12/2018  Nervous, Anxious, on Edge 3 2 1 3   Control/stop worrying 3 2 1 3   Worry too much - different things 3 2 1 3   Trouble relaxing 3 2 1 3   Restless 3 1 2 3   Easily annoyed or irritable 3 2 2 3   Afraid - awful might happen 3 1 2 2   Total GAD 7 Score 21 12 10 20   Anxiety Difficulty Extremely difficult Somewhat difficult Somewhat difficult Very difficult    -------------------------------------------------------------------------- O: No physical exam performed due to remote telephone encounter.  Lab results reviewed.  No results found for this or any previous visit (from the past 2160 hour(s)).  -------------------------------------------------------------------------- A&P:  Problem List Items Addressed This Visit    PTSD (post-traumatic stress disorder)    Still improved on current regimen with SNRI - new concern inattention possible ADD affecting her Recent life stressors situational w/ COVID / School Improved - Associated secondary panic attacks, insomnia - chronic PTSD w/ comorbid depression/anxiety See prior note for background info Secondary sleep maintenance insomnia as well, underlying issue to mental health - Prior failed treatment years ago SSRI Sertraline, Wellbutrin, Trazodone - No local Psychiatry or therapist - Past history of suicidal ideation, prior attempt years ago - now seems improved, denies any active ideation or intent currently OFF Venlafaxine  Plan: Continue Duloxetine (Cymbalta) 60mg  daily - Continue Buspar 7.5mg  BID - Continue her Gabapentin per  Orthopedics - advised consider dose increase in future - Continue intermittent use of Ambien CR 6.25mg  PRN nightly insomnia  Previously, detailed handout given for self referral or office referral options for Psychiatry / mental health provider therapist - pending further review  Follow-up sooner 3-6 months for PTSD, med adjust, GAD7/PHQ9 - sooner if needed       Other Visit Diagnoses    Inattention    -  Primary     Suspect may have inattentive type ADD given her long history of similar problem, also some fam history She should proceed with initial Psych evaluation for ADD diagnostic and consider treatment  Patient to notify our office with requested location for Psychiatry/ADD initial testing and treatment, anticipate [facility]. Will submit referral once hear back.  UPDATE 07/29/19 at 1:00pm, received call that patient requested to have referral and notes sent to Dr at [facility].  Reason for Referral: Possible chronic ADD requesting initial evaluation testing and management, also has comorbid psychiatric mental health conditions with PTSD, Generalized Anxiety Disorder, mood disorder / insomnia, on medication management from PCP currently, may benefit  from additional mental health services and counseling Referral discussed with patient: yes Best contact number of patient for referral team: [phone] (M)  Has patient been seen by a specialist for this issue before: no If so, who (practice/provider): n/a Does the patient wish to return: n/a Patient provider preference for referral: Donivan Scull, PhD pHSP-P Patient location preference for referral: [facility] location  Notes and mediation list fax to [phone] attn. Dr Araceli Bouche   Orders Placed This Encounter  Procedures  . Ambulatory referral to Psychology    Referral Priority:   Routine    Referral Type:   Psychiatric    Referral Reason:   Specialty  Services Required    Requested Specialty:   Psychology    Number of Visits Requested:   1     No orders of the defined types were placed in this encounter.   Follow-up: As needed  Patient verbalizes understanding with the above medical recommendations including the limitation of remote medical advice.  Specific follow-up and call-back criteria were given for patient to follow-up or seek medical care more urgently if needed.   - Time spent in direct consultation with patient on phone: 8 minutes   Nobie Putnam, [facility] 07/29/2019, 10:08 AM

## 2019-08-04 DIAGNOSIS — F4312 Post-traumatic stress disorder, chronic: Secondary | ICD-10-CM | POA: Diagnosis not present

## 2019-08-18 DIAGNOSIS — F4312 Post-traumatic stress disorder, chronic: Secondary | ICD-10-CM | POA: Diagnosis not present

## 2019-08-20 DIAGNOSIS — M47814 Spondylosis without myelopathy or radiculopathy, thoracic region: Secondary | ICD-10-CM | POA: Diagnosis not present

## 2019-08-20 DIAGNOSIS — M545 Low back pain: Secondary | ICD-10-CM | POA: Diagnosis not present

## 2019-09-01 DIAGNOSIS — F4312 Post-traumatic stress disorder, chronic: Secondary | ICD-10-CM | POA: Diagnosis not present

## 2019-09-08 DIAGNOSIS — F902 Attention-deficit hyperactivity disorder, combined type: Secondary | ICD-10-CM | POA: Diagnosis not present

## 2019-09-08 DIAGNOSIS — F331 Major depressive disorder, recurrent, moderate: Secondary | ICD-10-CM | POA: Diagnosis not present

## 2019-09-08 DIAGNOSIS — F4312 Post-traumatic stress disorder, chronic: Secondary | ICD-10-CM | POA: Diagnosis not present

## 2019-10-20 DIAGNOSIS — F902 Attention-deficit hyperactivity disorder, combined type: Secondary | ICD-10-CM | POA: Diagnosis not present

## 2019-10-20 DIAGNOSIS — F331 Major depressive disorder, recurrent, moderate: Secondary | ICD-10-CM | POA: Diagnosis not present

## 2019-10-20 DIAGNOSIS — F4312 Post-traumatic stress disorder, chronic: Secondary | ICD-10-CM | POA: Diagnosis not present

## 2019-10-21 ENCOUNTER — Telehealth: Payer: Self-pay | Admitting: Family Medicine

## 2019-10-21 NOTE — Telephone Encounter (Signed)
Called patient back CT scan was ordered by TMJ, Facial Pain, TMD - [facility]--but they don't Rx Abx so apt scheduled for tomorrow for sinusitis virtual around 1:20 pm.

## 2019-10-21 NOTE — Telephone Encounter (Signed)
Copied from CRM [id number]. Topic: General - Other >> Oct 21, 2019  2:43 PM Dalphine Handing A wrote: Patient is requesting a callback from Dr. Althea Charon nurse in regards to a recent Ct scan that revealed patient has sinuitis and tonsil stones and wants to know if she can get an antibiotic. Please advise

## 2019-10-21 NOTE — Telephone Encounter (Signed)
Acknowledged. I agree, am not the ordering physician on the CT.  Thanks. Will see her 4/14 and discuss antibiotics.  [name], DO [facility] 10/21/2019, 5:25 PM

## 2019-10-22 ENCOUNTER — Other Ambulatory Visit: Payer: Self-pay

## 2019-10-22 ENCOUNTER — Telehealth (INDEPENDENT_AMBULATORY_CARE_PROVIDER_SITE_OTHER): Payer: Medicaid Other | Admitting: Family Medicine

## 2019-10-22 ENCOUNTER — Encounter: Payer: Self-pay | Admitting: Family Medicine

## 2019-10-22 DIAGNOSIS — B379 Candidiasis, unspecified: Secondary | ICD-10-CM

## 2019-10-22 DIAGNOSIS — T3695XA Adverse effect of unspecified systemic antibiotic, initial encounter: Secondary | ICD-10-CM

## 2019-10-22 DIAGNOSIS — J320 Chronic maxillary sinusitis: Secondary | ICD-10-CM

## 2019-10-22 MED ORDER — AMOXICILLIN-POT CLAVULANATE 875-125 MG PO TABS: 1.0000 | ORAL_TABLET | Freq: Two times a day (BID) | ORAL | 0 refills | Status: DC

## 2019-10-22 MED ORDER — FLUCONAZOLE 150 MG PO TABS: ORAL_TABLET | ORAL | 0 refills | Status: DC

## 2019-10-22 MED ORDER — FLUTICASONE PROPIONATE 50 MCG/ACT NA SUSP: 2.0000 | Freq: Every day | NASAL | 3 refills | Status: DC

## 2019-10-22 NOTE — Patient Instructions (Addendum)
1 Start Augmentin 875-125mg  PO BID x 10 days 2. Add diflucan if need for antibiotic yeast infection 3. Start nasal steroid Flonase 2 sprays in each nostril daily for 4-6 weeks, may repeat course seasonally or as needed   Follow up if not improved. We can consider switch antibiotic.  Please schedule a Follow-up Appointment to: Return in about 2 weeks (around 11/05/2019), or if symptoms worsen or fail to improve, for sinusitis.  If you have any other questions or concerns, please feel free to call the office or send a message through MyChart. You may also schedule an earlier appointment if necessary.  Additionally, you may be receiving a survey about your experience at our office within a few days to 1 week by e-mail or mail. We value your feedback.  [name], DO [facility], [place]

## 2019-10-22 NOTE — Progress Notes (Signed)
Virtual Visit via Telephone The purpose of this virtual visit is to provide medical care while limiting exposure to the novel coronavirus (COVID19) for both patient and office staff.  Consent was obtained for phone visit:  Yes.   Answered questions that patient had about telehealth interaction:  Yes.   I discussed the limitations, risks, security and privacy concerns of performing an evaluation and management service by telephone. I also discussed with the patient that there may be a patient responsible charge related to this service. The patient expressed understanding and agreed to proceed.  Patient Location: Home Provider Location: [facility])  ---------------------------------------------------------------------- Chief Complaint  Patient presents with  . Sinusitis    onset 3 months    S: Reviewed CMA documentation. I have called patient and gathered additional HPI as follows:  Chronic Sinusitis, maxillary Reports that symptoms started >3 months ago with issues of TMJ jaw pain and sinus pressure and pain, she has been on sudafed regularly with some temporary relief of sinus pressure, she has tried to stop sudafed for up to 2 weeks at a time. Usually when takes Sudafed/Excedrin it does help provide relief from headache but she has some rebound symptoms. - She is followed by orofacial surgery specialist / [facility] orofacial pain, they did CT in 09/2019 see results below, they are managing her TMJ and other issues. Now asked her to be seen by me for sinusitis given finding on CT - No recent antibiotic course. Has helped in past. No allergies  Denies any known or suspected exposure to person with or possibly with COVID19.  Admits sinus pain or pressure Denies any fevers, chills, sweats, body ache, cough, shortness of breath, abdominal pain, diarrhea  Past Medical History:  Diagnosis Date  . Anxiety   . Depression    Social History   Tobacco Use  .  Smoking status: Current Every Day Smoker    Packs/day: 1.00    Years: 20.00    Pack years: 20.00    Types: Cigarettes  . Smokeless tobacco: Never Used  Substance Use Topics  . Alcohol use: Yes  . Drug use: Never    Current Outpatient Medications:  .  gabapentin (NEURONTIN) 300 MG capsule, Take 300 mg by mouth 3 (three) times daily., Disp: , Rfl:  .  lisdexamfetamine (VYVANSE) 50 MG capsule, Take 50 mg by mouth daily., Disp: , Rfl:  .  tiZANidine (ZANAFLEX) 2 MG tablet, Take 2 mg by mouth 2 (two) times daily. One tablet in twice daily and two at bedtime., Disp: , Rfl:  .  traMADol (ULTRAM) 50 MG tablet, Take 50 mg by mouth 3 (three) times daily. Rarely take only PRN, limited supply only, Disp: , Rfl:  .  traZODone (DESYREL) 100 MG tablet, Take 100 mg by mouth at bedtime., Disp: , Rfl:  .  busPIRone (BUSPAR) 7.5 MG tablet, Take 1 tablet (7.5 mg total) by mouth 2 (two) times daily. (Patient not taking: Reported on 10/22/2019), Disp: 180 tablet, Rfl: 2 .  DULoxetine (CYMBALTA) 60 MG capsule, Take 1 capsule (60 mg total) by mouth daily. (Patient not taking: Reported on 07/29/2019), Disp: 30 capsule, Rfl: 5 .  zolpidem (AMBIEN CR) 6.25 MG CR tablet, Take 1 tablet (6.25 mg total) by mouth at bedtime as needed for sleep. (Patient not taking: Reported on 07/29/2019), Disp: 30 tablet, Rfl: 2  Depression screen [facility] 2/9 10/22/2019 07/29/2019 02/25/2019  Decreased Interest 0 3 2  Down, Depressed, Hopeless 0 2 2  PHQ -  2 Score 0 5 4  Altered sleeping - 3 3  Tired, decreased energy - 2 2  Change in appetite - 2 1  Feeling bad or failure about yourself  - 2 2  Trouble concentrating - 3 1  Moving slowly or fidgety/restless - 3 1  Suicidal thoughts - 1 0  PHQ-9 Score - 21 14  Difficult doing work/chores - Extremely dIfficult Somewhat difficult    GAD 7 : Generalized Anxiety Score 07/29/2019 02/25/2019 01/21/2019 11/12/2018  Nervous, Anxious, on Edge 3 2 1 3   Control/stop worrying 3 2 1 3   Worry too much -  different things 3 2 1 3   Trouble relaxing 3 2 1 3   Restless 3 1 2 3   Easily annoyed or irritable 3 2 2 3   Afraid - awful might happen 3 1 2 2   Total GAD 7 Score 21 12 10 20   Anxiety Difficulty Extremely difficult Somewhat difficult Somewhat difficult Very difficult    -------------------------------------------------------------------------- O: No physical exam performed due to remote telephone encounter.  Lab results reviewed.  CT Sinus 10/07/19 Reported with moderate to severe polypoid mucosal thickening in R and L maxillary sinuses, ostia are occluded with mucosal thickening.  Report: Cone-beam computed tomography is for bilateral evaluation of TMJs. The  patient was imaged with the NewTom NT5G CBCT scanner using an 18x18x16 cm  field of view.  Findings:  Right TMJ: The condyle is positioned posteriorly and slightly laterally  within the fossa. The condyle shows smooth and continuous borders with  mild flattening on the articular surface. Internal structure shows a  uniform trabecular pattern. The glenoid fossa is normal with a steep  posterior slope of the articular eminence.  Left TMJ: The condyle is positioned posteriorly and slightly laterally  within the fossa. The condyle shows smooth and continuous borders with  mild flattening on the articular surface. Internal structure shows a  uniform trabecular pattern. The glenoid fossa is normal.  Incidental Findings:   Intervertebral joint space reduction and osteophyte formation are present  between C3 and C4.  Moderate to severe polypoid mucosal thickening is present in the right and  left maxillary sinuses. The ostia are occluded with the mucosal  thickening. Sinus borders remain intact. Findings are consistent with  sinusitis.  Multiple flecks of calcification are present lateral to the oropharyngeal  airway, consistent with tonsilloliths.   Bilaterally, the stylohyoid ligaments are elongated with  pseudojoint  formation, consistent with stylohyoid ligament ossification.  A small area of low density is present between the roots of teeth #s 21  and 22, most likely a marrow space.  Impressions: Mandibular condyles with remodeling Mild degenerative changes of the cervical spine Sinusitis Tonsilloliths Stylohyoid ligament ossification   No results found for this or any previous visit (from the past 2160 hour(s)).  -------------------------------------------------------------------------- A&P:    Problem List Items Addressed This Visit    None    Visit Diagnoses    Chronic sinusitis of both maxillary sinuses    -  Primary   Antibiotic-induced yeast infection         Consistent with chronic maxillary bilateral sinusitis, likely initially allergic rhinitis component with worsening concern for bacterial infection now duration >3 months or more.  Already worked up by orofacial surgery/dentist on TMJ and other related causes.  Now concern on CT imaging 09/2019 with maxillary chronic sinusitis  Plan: 1 Start Augmentin 875-125mg  PO BID x 10 days 2. Add diflucan if need for antibiotic yeast infection 3. Start nasal  steroid Flonase 2 sprays in each nostril daily for 4-6 weeks, may repeat course seasonally or as needed   Return criteria reviewed   No orders of the defined types were placed in this encounter.   Follow-up: - Return in 2 weeks if needed  Patient verbalizes understanding with the above medical recommendations including the limitation of remote medical advice.  Specific follow-up and call-back criteria were given for patient to follow-up or seek medical care more urgently if needed.   - Time spent in direct consultation with patient on phone: 9 minutes   [name], DO [facility] 10/22/2019, 1:28 PM

## 2019-10-29 ENCOUNTER — Telehealth: Payer: Self-pay

## 2019-10-29 DIAGNOSIS — R110 Nausea: Secondary | ICD-10-CM

## 2019-10-29 MED ORDER — ONDANSETRON 4 MG PO TBDP: 4.0000 mg | ORAL_TABLET | Freq: Three times a day (TID) | ORAL | 0 refills | Status: DC | PRN

## 2019-10-29 NOTE — Telephone Encounter (Signed)
Patient notified

## 2019-10-29 NOTE — Telephone Encounter (Signed)
Copied from CRM [id number]. Topic: General - Inquiry >> Oct 29, 2019  8:41 AM Deborha Payment wrote: Reason for CRM: Patient is requesting zofran due to the medication PCP just prescribed amoxicillin-clavulanate (AUGMENTIN) 875-125 MG tablet  Patient states she is nauseous and would like to take zofran with medication,. PHARMACY [id number] - [place], [place] - [address] AT [facility] [address] [facility]  Phone:  [phone] Fax:  [phone]

## 2019-10-29 NOTE — Telephone Encounter (Signed)
Sent Zofran ODT dissolving tab to [facility] [address]  [name], DO [facility] 10/29/2019, 1:23 PM

## 2019-11-06 ENCOUNTER — Telehealth: Payer: Self-pay

## 2019-11-06 DIAGNOSIS — J329 Chronic sinusitis, unspecified: Secondary | ICD-10-CM

## 2019-11-06 NOTE — Telephone Encounter (Signed)
Copied from CRM [id number]. Topic: Referral - Request for Referral >> Nov 06, 2019  9:08 AM Leafy Ro wrote: Has patient seen PCP for this complaint? Yes chronic sinusitis. Pt went to [facility]illofacial pain clinic at [facility] and seen dr Daralene Milch phone [phone] *If NO, is insurance requiring patient see PCP for this issue before PCP can refer them? Reason for referral: ENT for facial pain and chronic sinusitis. Pt medicaid

## 2019-11-06 NOTE — Telephone Encounter (Signed)
Looks like she is asking for ENT Referral for chronic sinusitis pain.  Which ENT does she want to go to? I would usually refer to local [facility] in [place] or [place].  She was seen by [facility] provider already - did they recommend [facility]?  I can refer wherever she will prefer to go.  [name], DO [facility] 11/06/2019, 12:09 PM

## 2019-11-06 NOTE — Addendum Note (Signed)
Addended by: Smitty Cords on: 11/06/2019 02:27 PM   Modules accepted: Orders

## 2019-11-06 NOTE — Telephone Encounter (Signed)
As per patient [facility] will work.

## 2019-11-10 DIAGNOSIS — M26623 Arthralgia of bilateral temporomandibular joint: Secondary | ICD-10-CM | POA: Diagnosis not present

## 2019-11-10 DIAGNOSIS — J329 Chronic sinusitis, unspecified: Secondary | ICD-10-CM | POA: Diagnosis not present

## 2019-11-17 DIAGNOSIS — F4312 Post-traumatic stress disorder, chronic: Secondary | ICD-10-CM | POA: Diagnosis not present

## 2019-11-17 DIAGNOSIS — F902 Attention-deficit hyperactivity disorder, combined type: Secondary | ICD-10-CM | POA: Diagnosis not present

## 2019-11-17 DIAGNOSIS — F331 Major depressive disorder, recurrent, moderate: Secondary | ICD-10-CM | POA: Diagnosis not present

## 2019-11-19 DIAGNOSIS — M47814 Spondylosis without myelopathy or radiculopathy, thoracic region: Secondary | ICD-10-CM | POA: Diagnosis not present

## 2019-11-19 DIAGNOSIS — M545 Low back pain: Secondary | ICD-10-CM | POA: Diagnosis not present

## 2019-12-15 DIAGNOSIS — F331 Major depressive disorder, recurrent, moderate: Secondary | ICD-10-CM | POA: Diagnosis not present

## 2019-12-15 DIAGNOSIS — F902 Attention-deficit hyperactivity disorder, combined type: Secondary | ICD-10-CM | POA: Diagnosis not present

## 2019-12-15 DIAGNOSIS — F4312 Post-traumatic stress disorder, chronic: Secondary | ICD-10-CM | POA: Diagnosis not present

## 2019-12-15 DIAGNOSIS — J320 Chronic maxillary sinusitis: Secondary | ICD-10-CM | POA: Diagnosis not present

## 2019-12-30 ENCOUNTER — Encounter: Payer: Self-pay | Admitting: Otolaryngology

## 2019-12-30 ENCOUNTER — Other Ambulatory Visit: Payer: Self-pay

## 2020-01-02 ENCOUNTER — Other Ambulatory Visit
Admission: RE | Admit: 2020-01-02 | Discharge: 2020-01-02 | Disposition: A | Discharge status: Home / Self Care | Payer: Medicaid Other | Source: Ambulatory Visit | Attending: Otolaryngology | Admitting: Otolaryngology

## 2020-01-02 ENCOUNTER — Other Ambulatory Visit: Payer: Self-pay

## 2020-01-02 DIAGNOSIS — Z20822 Contact with and (suspected) exposure to covid-19: Secondary | ICD-10-CM | POA: Insufficient documentation

## 2020-01-02 DIAGNOSIS — Z01812 Encounter for preprocedural laboratory examination: Secondary | ICD-10-CM | POA: Insufficient documentation

## 2020-01-03 LAB — SARS CORONAVIRUS 2 (TAT 6-24 HRS): SARS Coronavirus 2: NEGATIVE

## 2020-01-06 ENCOUNTER — Other Ambulatory Visit: Payer: Self-pay

## 2020-01-06 ENCOUNTER — Encounter: Payer: Self-pay | Admitting: Otolaryngology

## 2020-01-06 ENCOUNTER — Ambulatory Visit
Admission: RE | Admit: 2020-01-06 | Discharge: 2020-01-06 | Disposition: A | Discharge status: Home / Self Care | Payer: Medicaid Other | Attending: Otolaryngology | Admitting: Otolaryngology

## 2020-01-06 ENCOUNTER — Ambulatory Visit: Payer: Medicaid Other | Admitting: Anesthesiology

## 2020-01-06 ENCOUNTER — Encounter
Admission: RE | Disposition: A | Discharge status: Home / Self Care | Payer: Self-pay | Source: Home / Self Care | Attending: Otolaryngology

## 2020-01-06 DIAGNOSIS — Z885 Allergy status to narcotic agent status: Secondary | ICD-10-CM | POA: Diagnosis not present

## 2020-01-06 DIAGNOSIS — F17210 Nicotine dependence, cigarettes, uncomplicated: Secondary | ICD-10-CM | POA: Diagnosis not present

## 2020-01-06 DIAGNOSIS — J45909 Unspecified asthma, uncomplicated: Secondary | ICD-10-CM | POA: Diagnosis not present

## 2020-01-06 DIAGNOSIS — J320 Chronic maxillary sinusitis: Secondary | ICD-10-CM | POA: Diagnosis not present

## 2020-01-06 DIAGNOSIS — J329 Chronic sinusitis, unspecified: Secondary | ICD-10-CM | POA: Diagnosis not present

## 2020-01-06 DIAGNOSIS — Z79899 Other long term (current) drug therapy: Secondary | ICD-10-CM | POA: Insufficient documentation

## 2020-01-06 DIAGNOSIS — Z79891 Long term (current) use of opiate analgesic: Secondary | ICD-10-CM | POA: Diagnosis not present

## 2020-01-06 DIAGNOSIS — M1990 Unspecified osteoarthritis, unspecified site: Secondary | ICD-10-CM | POA: Insufficient documentation

## 2020-01-06 HISTORY — PX: MAXILLARY ANTROSTOMY: SHX2003

## 2020-01-06 HISTORY — DX: Cough, unspecified: R05.9

## 2020-01-06 HISTORY — DX: Migraine, unspecified, not intractable, without status migrainosus: G43.909

## 2020-01-06 HISTORY — DX: Arthralgia of temporomandibular joint, unspecified side: M26.629

## 2020-01-06 HISTORY — PX: NASAL SINUS SURGERY: SHX719

## 2020-01-06 HISTORY — DX: Unspecified osteoarthritis, unspecified site: M19.90

## 2020-01-06 HISTORY — DX: Presence of spectacles and contact lenses: Z97.3

## 2020-01-06 SURGERY — MAXILLARY ANTROSTOMY
Anesthesia: General | Site: Nose

## 2020-01-06 MED ORDER — LIDOCAINE HCL (CARDIAC) PF 100 MG/5ML IV SOSY
PREFILLED_SYRINGE | INTRAVENOUS | Status: DC | PRN
  Administered 2020-01-06: 50 mg via INTRAVENOUS

## 2020-01-06 MED ORDER — LIDOCAINE-EPINEPHRINE 1 %-1:100000 IJ SOLN
INTRAMUSCULAR | Status: DC | PRN
  Administered 2020-01-06: 6 mL

## 2020-01-06 MED ORDER — MIDAZOLAM HCL 5 MG/5ML IJ SOLN
INTRAMUSCULAR | Status: DC | PRN
  Administered 2020-01-06: 2 mg via INTRAVENOUS

## 2020-01-06 MED ORDER — MEPERIDINE HCL 25 MG/ML IJ SOLN: 6.2500 mg | INTRAMUSCULAR | Status: DC | PRN

## 2020-01-06 MED ORDER — CEFDINIR 300 MG PO CAPS
300.0000 mg | ORAL_CAPSULE | Freq: Two times a day (BID) | ORAL | 0 refills | Status: DC
Start: 2020-01-06 — End: 2020-08-23

## 2020-01-06 MED ORDER — LIDOCAINE HCL 4 % MT SOLN
OROMUCOSAL | Status: DC | PRN
  Administered 2020-01-06: 4 mL via TOPICAL

## 2020-01-06 MED ORDER — ACETAMINOPHEN 10 MG/ML IV SOLN
1000.0000 mg | Freq: Once | INTRAVENOUS | Status: AC
  Administered 2020-01-06: 1000 mg via INTRAVENOUS

## 2020-01-06 MED ORDER — HYDROMORPHONE HCL 1 MG/ML IJ SOLN: 0.2500 mg | INTRAMUSCULAR | Status: DC | PRN

## 2020-01-06 MED ORDER — SUCCINYLCHOLINE CHLORIDE 20 MG/ML IJ SOLN
INTRAMUSCULAR | Status: DC | PRN
  Administered 2020-01-06: 80 mg via INTRAVENOUS

## 2020-01-06 MED ORDER — OXYCODONE-ACETAMINOPHEN 5-325 MG PO TABS: 1.0000 | ORAL_TABLET | Freq: Four times a day (QID) | ORAL | 0 refills | Status: DC | PRN

## 2020-01-06 MED ORDER — GLYCOPYRROLATE 0.2 MG/ML IJ SOLN
INTRAMUSCULAR | Status: DC | PRN
  Administered 2020-01-06: .1 mg via INTRAVENOUS

## 2020-01-06 MED ORDER — LACTATED RINGERS IV SOLN: INTRAVENOUS | Status: DC

## 2020-01-06 MED ORDER — OXYCODONE HCL 5 MG PO TABS: 5.0000 mg | ORAL_TABLET | Freq: Once | ORAL | Status: AC | PRN

## 2020-01-06 MED ORDER — FENTANYL CITRATE (PF) 100 MCG/2ML IJ SOLN
INTRAMUSCULAR | Status: DC | PRN
  Administered 2020-01-06: 50 ug via INTRAVENOUS

## 2020-01-06 MED ORDER — SCOPOLAMINE 1 MG/3DAYS TD PT72
1.0000 | MEDICATED_PATCH | TRANSDERMAL | Status: DC
  Administered 2020-01-06: 1.5 mg via TRANSDERMAL

## 2020-01-06 MED ORDER — PREDNISONE 10 MG (21) PO TBPK
ORAL_TABLET | ORAL | 0 refills | Status: DC
Start: 2020-01-06 — End: 2020-08-23

## 2020-01-06 MED ORDER — PROPOFOL 10 MG/ML IV BOLUS
INTRAVENOUS | Status: DC | PRN
  Administered 2020-01-06: 200 mg via INTRAVENOUS

## 2020-01-06 MED ORDER — DEXAMETHASONE SODIUM PHOSPHATE 4 MG/ML IJ SOLN
INTRAMUSCULAR | Status: DC | PRN
  Administered 2020-01-06: 10 mg via INTRAVENOUS

## 2020-01-06 MED ORDER — OXYMETAZOLINE HCL 0.05 % NA SOLN
NASAL | Status: DC | PRN
  Administered 2020-01-06: 1 via TOPICAL

## 2020-01-06 MED ORDER — PROMETHAZINE HCL 25 MG/ML IJ SOLN: 6.2500 mg | INTRAMUSCULAR | Status: DC | PRN

## 2020-01-06 MED ORDER — ONDANSETRON HCL 4 MG/2ML IJ SOLN
INTRAMUSCULAR | Status: DC | PRN
  Administered 2020-01-06: 4 mg via INTRAVENOUS

## 2020-01-06 MED ORDER — OXYCODONE HCL 5 MG/5ML PO SOLN
5.0000 mg | Freq: Once | ORAL | Status: AC | PRN
  Administered 2020-01-06: 5 mg via ORAL

## 2020-01-06 SURGICAL SUPPLY — 20 items
CANISTER SUCT 1200ML W/VALVE (MISCELLANEOUS) ×4 IMPLANT
COAG SUCT 10F 3.5MM HAND CTRL (MISCELLANEOUS) ×4 IMPLANT
ELECT REM PT RETURN 9FT ADLT (ELECTROSURGICAL) ×4
ELECTRODE REM PT RTRN 9FT ADLT (ELECTROSURGICAL) ×2 IMPLANT
GLOVE BIO SURGEON STRL SZ7.5 (GLOVE) ×8 IMPLANT
GOWN STRL REUS W/ TWL LRG LVL3 (GOWN DISPOSABLE) ×2 IMPLANT
GOWN STRL REUS W/TWL LRG LVL3 (GOWN DISPOSABLE) ×2
IV NS 500ML (IV SOLUTION) ×2
IV NS 500ML BAXH (IV SOLUTION) ×2 IMPLANT
KIT TURNOVER KIT A (KITS) ×4 IMPLANT
NS IRRIG 500ML POUR BTL (IV SOLUTION) ×4 IMPLANT
PACK ENT CUSTOM (PACKS) ×4 IMPLANT
PACKING NASAL EPIS 4X2.4 XEROG (MISCELLANEOUS) ×8 IMPLANT
PATTIES SURGICAL .5 X3 (DISPOSABLE) ×4 IMPLANT
SHAVER DIEGO BLD STD TYPE A (BLADE) ×4 IMPLANT
SOL ANTI-FOG 6CC FOG-OUT (MISCELLANEOUS) ×2 IMPLANT
SOL FOG-OUT ANTI-FOG 6CC (MISCELLANEOUS) ×2
SYR 10ML LL (SYRINGE) ×4 IMPLANT
TUBING DECLOG MULTIDEBRIDER (TUBING) ×4 IMPLANT
WATER STERILE IRR 250ML POUR (IV SOLUTION) ×4 IMPLANT

## 2020-01-06 NOTE — Op Note (Signed)
01/06/2020  10:13 AM    Veronica Lyons  [id number]   Pre-Op Diagnosis:  chronic maxillary sinusitis  Post-op Diagnosis: chronic maxillary sinusitis  Procedure: Bilateral Endoscopic Maxillary Antrostomies with Tissue Removal    Surgeon:  Sandi Mealy  Anesthesia:  General endotracheal  EBL:  25cc  Complications:  None  Findings: Narrow OMC bilaterally. No active infection in sinuses  Procedure: After the patient was identified in holding and the benefits of the procedure were reviewed as well as the consent and risks, the patient was taken to the operating room and with the patient in a comfortable supine position,  general orotracheal anesthesia was induced without difficulty.  A proper time-out was performed.  The Stryker image guidance system was set up and calibrated in the normal fashion and felt to be acceptable.  Next 1% Xylocaine with 1:100,000 epinephrine was infiltrated into the inferior turbinates, septum, and anterior middle turbinates bilaterally.  Several minutes were allowed for this to take effect.  Cottoniod pledgets soaked in Afrin were placed into both nasal cavities and left while the patient was prepped and draped in the standard fashion.   The left middle turbinate was medialized and the uncinate process then resected with through-cutting forceps as well as the microdebrider. In this fashion the uncinate was completely removed along with soft tissue and bone of the medial wall of the maxillary sinus to create a large patent maxillary antrostomy. The left maxillary sinus was suctioned to clear secretions.   Attention was then turned to the right side where the same procedure was performed, opening the maxillary sinus as described above.   The nose was suctioned and inspected. The maxillary sinuses were irrigated with saline. Xerogel absorbable sinus packing was then placed in the ethmoid cavities bilaterally.   The patient was then returned to the  anesthesiologist for awakening and taken to recovery room in good condition postoperatively.  Disposition:   PACU and d/c home  Plan: Ice, elevation, narcotic analgesia and prophylactic antibiotics. Begin sinus irrigations with saline tomrrow, irrigating 3-4 times daily. Return to the office in 7 days.  Return to work in 7-10 days, no strenuous activities for two weeks.   Sandi Mealy 01/06/2020 10:13 AM

## 2020-01-06 NOTE — Anesthesia Procedure Notes (Signed)
Procedure Name: Intubation Date/Time: 01/06/2020 9:08 AM Performed by: Jimmy Picket, CRNA Pre-anesthesia Checklist: Patient identified, Emergency Drugs available, Suction available, Patient being monitored and Timeout performed Patient Re-evaluated:Patient Re-evaluated prior to induction Oxygen Delivery Method: Circle system utilized Preoxygenation: Pre-oxygenation with 100% oxygen Induction Type: IV induction Ventilation: Mask ventilation without difficulty Laryngoscope Size: Miller and 2 Grade View: Grade I Tube type: Oral Rae Tube size: 7.0 mm Number of attempts: 1 Placement Confirmation: ETT inserted through vocal cords under direct vision,  positive ETCO2 and breath sounds checked- equal and bilateral Tube secured with: Tape Dental Injury: Teeth and Oropharynx as per pre-operative assessment

## 2020-01-06 NOTE — Anesthesia Postprocedure Evaluation (Signed)
Anesthesia Post Note  Patient: Veronica Lyons  Procedure(s) Performed: MAXILLARY ANTROSTOMY with tissue (Bilateral Nose) ENDOSCOPIC SINUS SURGERY (Bilateral Nose)     Patient location during evaluation: PACU Anesthesia Type: General Level of consciousness: awake and alert Pain management: pain level controlled Vital Signs Assessment: post-procedure vital signs reviewed and stable Respiratory status: spontaneous breathing, nonlabored ventilation, respiratory function stable and patient connected to nasal cannula oxygen Cardiovascular status: blood pressure returned to baseline and stable Postop Assessment: no apparent nausea or vomiting Anesthetic complications: no   No complications documented.  Veronica Lyons A  Veronica Lyons

## 2020-01-06 NOTE — Anesthesia Preprocedure Evaluation (Signed)
Anesthesia Evaluation  Patient identified by MRN, date of birth, ID band Patient awake    Reviewed: Allergy & Precautions, NPO status , Patient's Chart, lab work & pertinent test results, reviewed documented beta blocker date and time   History of Anesthesia Complications Negative for: history of anesthetic complications  Airway Mallampati: II  TM Distance: >3 FB Neck ROM: Full    Dental  (+)    Pulmonary asthma , Current Smoker and Patient abstained from smoking.,    breath sounds clear to auscultation       Cardiovascular (-) angina(-) DOE  Rhythm:Regular Rate:Normal     Neuro/Psych  Headaches, PSYCHIATRIC DISORDERS (PTSD) Anxiety Depression  Neuromuscular disease (Cervical radiculopathy)    GI/Hepatic neg GERD  ,  Endo/Other    Renal/GU      Musculoskeletal  (+) Arthritis , Osteoarthritis,   TMJ syndrome   Abdominal   Peds  Hematology   Anesthesia Other Findings   Reproductive/Obstetrics                            Anesthesia Physical Anesthesia Plan  ASA: II  Anesthesia Plan: General   Post-op Pain Management:    Induction: Intravenous  PONV Risk Score and Plan: 3 and Treatment may vary due to age or medical condition, Ondansetron, Dexamethasone, Scopolamine patch - Pre-op and Midazolam  Airway Management Planned: Oral ETT  Additional Equipment:   Intra-op Plan:   Post-operative Plan: Extubation in OR  Informed Consent: I have reviewed the patients History and Physical, chart, labs and discussed the procedure including the risks, benefits and alternatives for the proposed anesthesia with the patient or authorized representative who has indicated his/her understanding and acceptance.       Plan Discussed with: CRNA and Anesthesiologist  Anesthesia Plan Comments:         Anesthesia Quick Evaluation

## 2020-01-06 NOTE — H&P (Signed)
History and physical reviewed and will be scanned in later. No change in medical status reported by the patient or family, appears stable for surgery. All questions regarding the procedure answered, and patient (or family if a child) expressed understanding of the procedure. ? ?Ymani Porcher S Dequincy Born ?[email or web address]@ ?

## 2020-01-06 NOTE — Discharge Instructions (Signed)
[facility] ENDOSCOPIC SINUS SURGERY [facility]  What is Functional Endoscopic Sinus Surgery?  The Surgery involves making the natural openings of the sinuses larger by removing the bony partitions that separate the sinuses from the nasal cavity.  The natural sinus lining is preserved as much as possible to allow the sinuses to resume normal function after the surgery.  In some patients nasal polyps (excessively swollen lining of the sinuses) may be removed to relieve obstruction of the sinus openings.  The surgery is performed through the nose using lighted scopes, which eliminates the need for incisions on the face.  A septoplasty is a different procedure which is sometimes performed with sinus surgery.  It involves straightening the boy partition that separates the two sides of your nose.  A crooked or deviated septum may need repair if is obstructing the sinuses or nasal airflow.  Turbinate reduction is also often performed during sinus surgery.  The turbinates are bony proturberances from the side walls of the nose which swell and can obstruct the nose in patients with sinus and allergy problems.  Their size can be surgically reduced to help relieve nasal obstruction.  What Can Sinus Surgery Do For Me?  Sinus surgery can reduce the frequency of sinus infections requiring antibiotic treatment.  This can provide improvement in nasal congestion, post-nasal drainage, facial pressure and nasal obstruction.  Surgery will NOT prevent you from ever having an infection again, so it usually only for patients who get infections 4 or more times yearly requiring antibiotics, or for infections that do not clear with antibiotics.  It will not cure nasal allergies, so patients with allergies may still require medication to treat their allergies after surgery. Surgery may improve headaches related to sinusitis, however, some people will continue to  require medication to control sinus headaches related to allergies.  Surgery will do nothing for other forms of headache (migraine, tension or cluster).  What Are the Risks of Endoscopic Sinus Surgery?  Current techniques allow surgery to be performed safely with little risk, however, there are rare complications that patients should be aware of.  Because the sinuses are located around the eyes, there is risk of eye injury, including blindness, though again, this would be quite rare. This is usually a result of bleeding behind the eye during surgery, which puts the vision oat risk, though there are treatments to protect the vision and prevent permanent disrupted by surgery causing a leak of the spinal fluid that surrounds the brain.  More serious complications would include bleeding inside the brain cavity or damage to the brain.  Again, all of these complications are uncommon, and spinal fluid leaks can be safely managed surgically if they occur.  The most common complication of sinus surgery is bleeding from the nose, which may require packing or cauterization of the nose.  Continued sinus have polyps may experience recurrence of the polyps requiring revision surgery.  Alterations of sense of smell or injury to the tear ducts are also rare complications.   What is the Surgery Like, and what is the Recovery?  The Surgery usually takes a couple of hours to perform, and is usually performed under a general anesthetic (completely asleep).  Patients are usually discharged home after a couple of hours.  Sometimes during surgery it is necessary to pack the nose to control bleeding, and the packing is left in place for 24 - 48 hours, and removed by your surgeon.    If a septoplasty was performed during the procedure, there is often a splint placed which must be removed after 5-7 days.   Discomfort: Pain is usually mild to moderate, and can be controlled by prescription pain medication or acetaminophen (Tylenol).   Aspirin, Ibuprofen (Advil, Motrin), or Naprosyn (Aleve) should be avoided, as they can cause increased bleeding.  Most patients feel sinus pressure like they have a bad head cold for several days.  Sleeping with your head elevated can help reduce swelling and facial pressure, as can ice packs over the face.  A humidifier may be helpful to keep the mucous and blood from drying in the nose.   Diet: There are no specific diet restrictions, however, you should generally start with clear liquids and a light diet of bland foods because the anesthetic can cause some nausea.  Advance your diet depending on how your stomach feels.  Taking your pain medication with food will often help reduce stomach upset which pain medications can cause.  Nasal Saline Irrigation: It is important to remove blood clots and dried mucous from the nose as it is healing.  This is done by having you irrigate the nose at least 3 - 4 times daily with a salt water solution.  We recommend using NeilMed Sinus Rinse (available at the drug store).  Fill the squeeze bottle with the solution, bend over a sink, and insert the tip of the squeeze bottle into the nose  of an inch.  Point the tip of the squeeze bottle towards the inside corner of the eye on the same side your irrigating.  Squeeze the bottle and gently irrigate the nose.  If you bend forward as you do this, most of the fluid will flow back out of the nose, instead of down your throat.   The solution should be warm, near body temperature, when you irrigate.   Each time you irrigate, you should use a full squeeze bottle.   Note that if you are instructed to use Nasal Steroid Sprays at any time after your surgery, irrigate with saline BEFORE using the steroid spray, so you do not wash it all out of the nose. Another product, Nasal Saline Gel (such as AYR Nasal Saline Gel) can be applied in each nostril 3 - 4 times daily to moisture the nose and reduce scabbing or crusting.  Bleeding:   Bloody drainage from the nose can be expected for several days, and patients are instructed to irrigate their nose frequently with salt water to help remove mucous and blood clots.  The drainage may be dark red or brown, though some fresh blood may be seen intermittently, especially after irrigation.  Do not blow you nose, as bleeding may occur. If you must sneeze, keep your mouth open to allow air to escape through your mouth.  If heavy bleeding occurs: Irrigate the nose with saline to rinse out clots, then spray the nose 3 - 4 times with Afrin Nasal Decongestant Spray.  The spray will constrict the blood vessels to slow bleeding.  Pinch the lower half of your nose shut to apply pressure, and lay down with your head elevated.  Ice packs over the nose may help as well. If bleeding persists despite these measures, you should notify your doctor.  Do not use the Afrin routinely to control nasal congestion after surgery, as it can result in worsening congestion and may affect healing.   Activity: Return to work varies among patients. Most patients will be out of   work at least 5 - 7 days to recover.  Patient may return to work after they are off of narcotic pain medication, and feeling well enough to perform the functions of their job.  Patients must avoid heavy lifting (over 10 pounds) or strenuous physical for 2 weeks after surgery, so your employer may need to assign you to light duty, or keep you out of work longer if light duty is not possible.  NOTE: you should not drive, operate dangerous machinery, do any mentally demanding tasks or make any important legal or financial decisions while on narcotic pain medication and recovering from the general anesthetic.    Call Your Doctor Immediately if You Have Any of the Following: 1. Bleeding that you cannot control with the above measures 2. Loss of vision, double vision, bulging of the eye or black eyes. 3. Fever over 101 degrees 4. Neck stiffness with severe  headache, fever, nausea and change in mental state. You are always encourage to call anytime with concerns, however, please call with requests for pain medication refills during office hours.  Office Endoscopy: During follow-up visits your doctor will remove any packing or splints that may have been placed and evaluate and clean your sinuses endoscopically.  Topical anesthetic will be used to make this as comfortable as possible, though you may want to take your pain medication prior to the visit.  How often this will need to be done varies from patient to patient.  After complete recovery from the surgery, you may need follow-up endoscopy from time to time, particularly if there is concern of recurrent infection or nasal polyps.   Scopolamine skin patches Remove in 72 hrs. Wash hands immediately after removal. What is this medicine? SCOPOLAMINE (skoe POL a meen) is used to prevent nausea and vomiting caused by motion sickness, anesthesia and surgery. This medicine may be used for other purposes; ask your health care provider or pharmacist if you have questions. COMMON BRAND NAME(S): Transderm Scop What should I tell my health care provider before I take this medicine? They need to know if you have any of these conditions:  are scheduled to have a gastric secretion test  glaucoma  heart disease  kidney disease  liver disease  lung or breathing disease, like asthma  mental illness  prostate disease  seizures  stomach or intestine problems  trouble passing urine  an unusual or allergic reaction to scopolamine, atropine, other medicines, foods, dyes, or preservatives  pregnant or trying to get pregnant  breast-feeding How should I use this medicine? This medicine is for external use only. Follow the directions on the prescription label. Wear only 1 patch at a time. Choose an area behind the ear, that is clean, dry, hairless and free from any cuts or irritation. Wipe the area  with a clean dry tissue. Peel off the plastic backing of the skin patch, trying not to touch the adhesive side with your hands. Do not cut the patches. Firmly apply to the area you have chosen, with the metallic side of the patch to the skin and the tan-colored side showing. Once firmly in place, wash your hands well with soap and water. Do not get this medicine into your eyes. After removing the patch, wash your hands and the area behind your ear thoroughly with soap and water. The patch will still contain some medicine after use. To avoid accidental contact or ingestion by children or pets, fold the used patch in half with the sticky side together   and throw away in the trash out of the reach of children and pets. If you need to use a second patch after you remove the first, place it behind the other ear. A special MedGuide will be given to you by the pharmacist with each prescription and refill. Be sure to read this information carefully each time. Talk to your pediatrician regarding the use of this medicine in children. Special care may be needed. Overdosage: If you think you have taken too much of this medicine contact a poison control center or emergency room at once. NOTE: This medicine is only for you. Do not share this medicine with others. What if I miss a dose? This does not apply. This medicine is not for regular use. What may interact with this medicine?  alcohol  antihistamines for allergy cough and cold  atropine  certain medicines for anxiety or sleep  certain medicines for bladder problems like oxybutynin, tolterodine  certain medicines for depression like amitriptyline, fluoxetine, sertraline  certain medicines for stomach problems like dicyclomine, hyoscyamine  certain medicines for Parkinson's disease like benztropine, trihexyphenidyl  certain medicines for seizures like phenobarbital, primidone  general anesthetics like halothane, isoflurane, methoxyflurane,  propofol  ipratropium  local anesthetics like lidocaine, pramoxine, tetracaine  medicines that relax muscles for surgery  phenothiazines like chlorpromazine, mesoridazine, prochlorperazine, thioridazine  narcotic medicines for pain  other belladonna alkaloids This list may not describe all possible interactions. Give your health care provider a list of all the medicines, herbs, non-prescription drugs, or dietary supplements you use. Also tell them if you smoke, drink alcohol, or use illegal drugs. Some items may interact with your medicine. What should I watch for while using this medicine? Limit contact with water while swimming and bathing because the patch may fall off. If the patch falls off, throw it away and put a new one behind the other ear. You may get drowsy or dizzy. Do not drive, use machinery, or do anything that needs mental alertness until you know how this medicine affects you. Do not stand or sit up quickly, especially if you are an older patient. This reduces the risk of dizzy or fainting spells. Alcohol may interfere with the effect of this medicine. Avoid alcoholic drinks. Your mouth may get dry. Chewing sugarless gum or sucking hard candy, and drinking plenty of water may help. Contact your healthcare professional if the problem does not go away or is severe. This medicine may cause dry eyes and blurred vision. If you wear contact lenses, you may feel some discomfort. Lubricating drops may help. See your healthcare professional if the problem does not go away or is severe. If you are going to need surgery, an MRI, CT scan, or other procedure, tell your healthcare professional that you are using this medicine. You may need to remove the patch before the procedure. What side effects may I notice from receiving this medicine? Side effects that you should report to your doctor or health care professional as soon as possible:  allergic reactions like skin rash, itching or  hives; swelling of the face, lips, or tongue  blurred vision  changes in vision  confusion  dizziness  eye pain  fast, irregular heartbeat  hallucinations, loss of contact with reality  nausea, vomiting  pain or trouble passing urine  restlessness  seizures  skin irritation  stomach pain Side effects that usually do not require medical attention (report to your doctor or health care professional if they continue or are bothersome):    drowsiness  dry mouth  headache  sore throat This list may not describe all possible side effects. Call your doctor for medical advice about side effects. You may report side effects to FDA at 1-800-FDA-1088. Where should I keep my medicine? Keep out of the reach of children. Store at room temperature between 20 and 25 degrees C (68 and 77 degrees F). Keep this medicine in the foil package until ready to use. Throw away any unused medicine after the expiration date. NOTE: This sheet is a summary. It may not cover all possible information. If you have questions about this medicine, talk to your doctor, pharmacist, or health care provider.  2020 Elsevier/Gold Standard (2017-09-14 16:14:46)    General Anesthesia, Adult, Care After This sheet gives you information about how to care for yourself after your procedure. Your health care provider may also give you more specific instructions. If you have problems or questions, contact your health care provider. What can I expect after the procedure? After the procedure, the following side effects are common:  Pain or discomfort at the IV site.  Nausea.  Vomiting.  Sore throat.  Trouble concentrating.  Feeling cold or chills.  Weak or tired.  Sleepiness and fatigue.  Soreness and body aches. These side effects can affect parts of the body that were not involved in surgery. Follow these instructions at home:  For at least 24 hours after the procedure:  Have a responsible adult  stay with you. It is important to have someone help care for you until you are awake and alert.  Rest as needed.  Do not: ? Participate in activities in which you could fall or become injured. ? Drive. ? Use heavy machinery. ? Drink alcohol. ? Take sleeping pills or medicines that cause drowsiness. ? Make important decisions or sign legal documents. ? Take care of children on your own. Eating and drinking  Follow any instructions from your health care provider about eating or drinking restrictions.  When you feel hungry, start by eating small amounts of foods that are soft and easy to digest (bland), such as toast. Gradually return to your regular diet.  Drink enough fluid to keep your urine pale yellow.  If you vomit, rehydrate by drinking water, juice, or clear broth. General instructions  If you have sleep apnea, surgery and certain medicines can increase your risk for breathing problems. Follow instructions from your health care provider about wearing your sleep device: ? Anytime you are sleeping, including during daytime naps. ? While taking prescription pain medicines, sleeping medicines, or medicines that make you drowsy.  Return to your normal activities as told by your health care provider. Ask your health care provider what activities are safe for you.  Take over-the-counter and prescription medicines only as told by your health care provider.  If you smoke, do not smoke without supervision.  Keep all follow-up visits as told by your health care provider. This is important. Contact a health care provider if:  You have nausea or vomiting that does not get better with medicine.  You cannot eat or drink without vomiting.  You have pain that does not get better with medicine.  You are unable to pass urine.  You develop a skin rash.  You have a fever.  You have redness around your IV site that gets worse. Get help right away if:  You have difficulty  breathing.  You have chest pain.  You have blood in your urine or stool, or you   vomit blood. Summary  After the procedure, it is common to have a sore throat or nausea. It is also common to feel tired.  Have a responsible adult stay with you for the first 24 hours after general anesthesia. It is important to have someone help care for you until you are awake and alert.  When you feel hungry, start by eating small amounts of foods that are soft and easy to digest (bland), such as toast. Gradually return to your regular diet.  Drink enough fluid to keep your urine pale yellow.  Return to your normal activities as told by your health care provider. Ask your health care provider what activities are safe for you. This information is not intended to replace advice given to you by your health care provider. Make sure you discuss any questions you have with your health care provider. Document Revised: 06/29/2017 Document Reviewed: 02/09/2017 Elsevier Patient Education  2020 Elsevier Inc.  

## 2020-01-06 NOTE — Transfer of Care (Signed)
Immediate Anesthesia Transfer of Care Note  Patient: Veronica Lyons  Procedure(s) Performed: MAXILLARY ANTROSTOMY with tissue (Bilateral Nose) ENDOSCOPIC SINUS SURGERY (Bilateral Nose)  Patient Location: PACU  Anesthesia Type: General  Level of Consciousness: awake, alert  and patient cooperative  Airway and Oxygen Therapy: Patient Spontanous Breathing and Patient connected to supplemental oxygen  Post-op Assessment: Post-op Vital signs reviewed, Patient's Cardiovascular Status Stable, Respiratory Function Stable, Patent Airway and No signs of Nausea or vomiting  Post-op Vital Signs: Reviewed and stable  Complications: No complications documented.

## 2020-01-07 ENCOUNTER — Encounter: Payer: Self-pay | Admitting: Otolaryngology

## 2020-01-08 LAB — SURGICAL PATHOLOGY

## 2020-01-21 DIAGNOSIS — M47814 Spondylosis without myelopathy or radiculopathy, thoracic region: Secondary | ICD-10-CM | POA: Diagnosis not present

## 2020-01-21 DIAGNOSIS — Z79899 Other long term (current) drug therapy: Secondary | ICD-10-CM | POA: Diagnosis not present

## 2020-01-21 DIAGNOSIS — Z5181 Encounter for therapeutic drug level monitoring: Secondary | ICD-10-CM | POA: Diagnosis not present

## 2020-01-21 DIAGNOSIS — M545 Low back pain: Secondary | ICD-10-CM | POA: Diagnosis not present

## 2020-01-28 DIAGNOSIS — J329 Chronic sinusitis, unspecified: Secondary | ICD-10-CM | POA: Diagnosis not present

## 2020-03-04 DIAGNOSIS — J320 Chronic maxillary sinusitis: Secondary | ICD-10-CM | POA: Diagnosis not present

## 2020-03-08 ENCOUNTER — Other Ambulatory Visit: Payer: Self-pay | Admitting: Family Medicine

## 2020-03-08 DIAGNOSIS — J320 Chronic maxillary sinusitis: Secondary | ICD-10-CM

## 2020-03-08 NOTE — Telephone Encounter (Signed)
Requested Prescriptions  Pending Prescriptions Disp Refills  . fluticasone (FLONASE) 50 MCG/ACT nasal spray [Pharmacy Med Name: FLUTICASONE NASAL SP (120) RX] 16 g 3    Sig: USE 2 SPRAYS IN EACH NOSTRIL DAILY; USE FOR 4 TO 6 WEEKS THEN STOP AND USE SEASONALLY OR AS NEEDED     Ear, Nose, and Throat: Nasal Preparations - Corticosteroids Passed - 03/08/2020 11:07 AM      Passed - Valid encounter within last 12 months    Recent Outpatient Visits          4 months ago Chronic sinusitis of both maxillary sinuses   [facility] Smitty Cords, DO   7 months ago Inattention   [facility] [place], Netta Neat, DO   1 year ago PTSD (post-traumatic stress disorder)   [facility] Smitty Cords, DO   1 year ago PTSD (post-traumatic stress disorder)   [facility] Smitty Cords, DO   1 year ago PTSD (post-traumatic stress disorder)   [facility], Netta Neat, DO

## 2020-03-12 DIAGNOSIS — J301 Allergic rhinitis due to pollen: Secondary | ICD-10-CM | POA: Diagnosis not present

## 2020-04-21 DIAGNOSIS — M47814 Spondylosis without myelopathy or radiculopathy, thoracic region: Secondary | ICD-10-CM | POA: Diagnosis not present

## 2020-04-21 DIAGNOSIS — Z79899 Other long term (current) drug therapy: Secondary | ICD-10-CM | POA: Diagnosis not present

## 2020-05-05 DIAGNOSIS — F902 Attention-deficit hyperactivity disorder, combined type: Secondary | ICD-10-CM | POA: Diagnosis not present

## 2020-05-05 DIAGNOSIS — F4312 Post-traumatic stress disorder, chronic: Secondary | ICD-10-CM | POA: Diagnosis not present

## 2020-05-05 DIAGNOSIS — F331 Major depressive disorder, recurrent, moderate: Secondary | ICD-10-CM | POA: Diagnosis not present

## 2020-06-07 DIAGNOSIS — Z20822 Contact with and (suspected) exposure to covid-19: Secondary | ICD-10-CM | POA: Diagnosis not present

## 2020-07-04 IMAGING — MR MR THORACIC SPINE W/O CM
6 series · 30 of 48 positions shown · non-contrast
Comparison: None.

CLINICAL DATA: Back pain after lifting heavy boxes 2 weeks ago.

EXAM:
MRI THORACIC SPINE WITHOUT CONTRAST
TECHNIQUE: Multiplanar, multisequence MR imaging of the thoracic spine was
performed. No intravenous contrast was administered.

[Series 16: T1 · sagittal · 5.0mm · 1.88mm/px · 2 of 9 slices shown (1 of 2)]
[im 1/9]
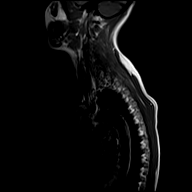
[im 9/9]
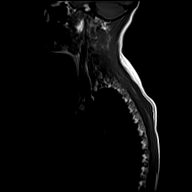

[Series 17: T2 · sagittal · 3.0mm · 1.06mm/px · 6 of 17 slices shown (1 of 2)]
[im 1/17]
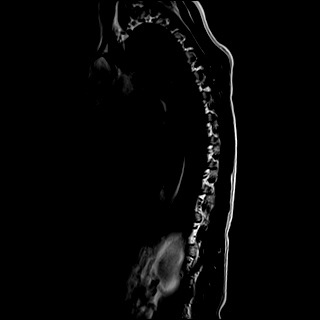
[im 4/17]
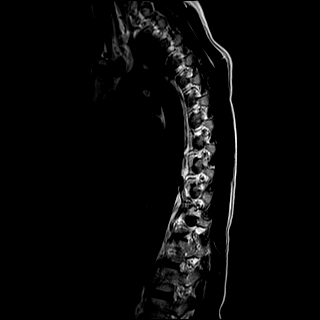
[im 7/17]
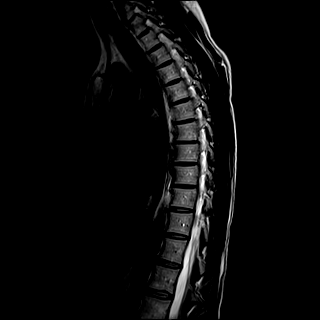
[im 10/17]
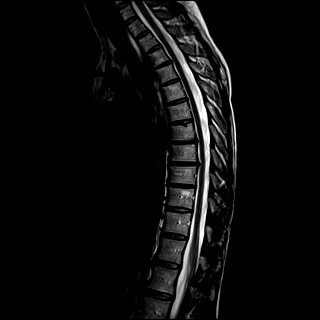
[im 13/17]
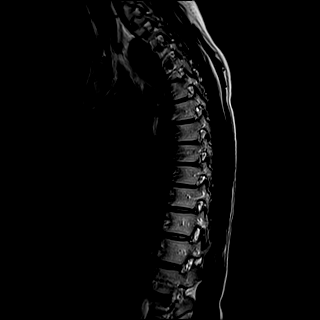
[im 17/17]
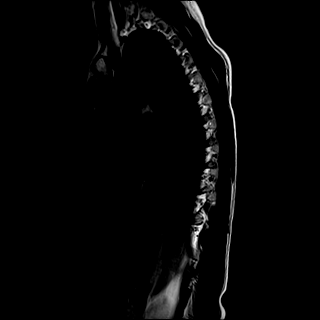

[Series 18: T1 · sagittal · 3.0mm · 1.06mm/px · 6 of 17 slices shown (2 of 2)]
[im 1/17]
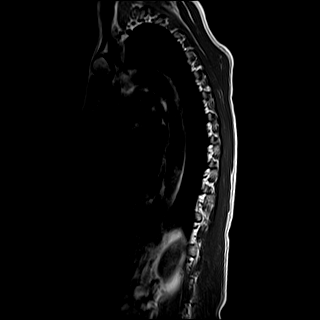
[im 4/17]
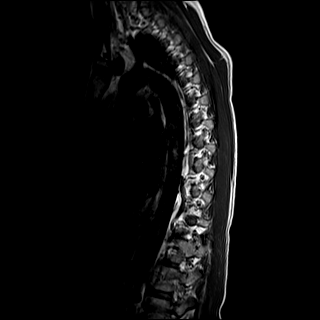
[im 7/17]
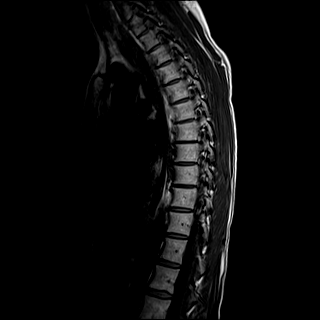
[im 10/17]
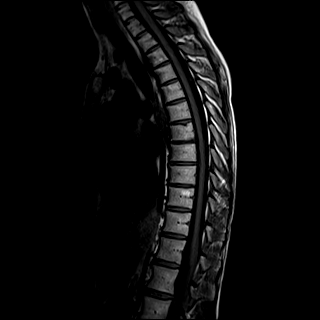
[im 13/17]
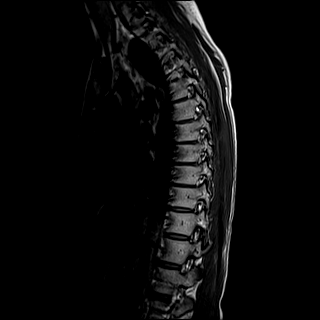
[im 17/17]
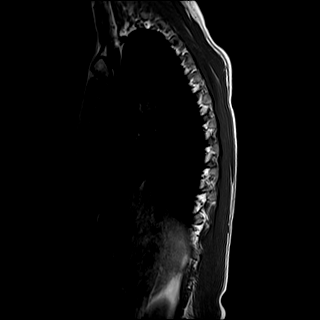

[Series 19: STIR · sagittal · 3.0mm · 0.53mm/px · 6 of 17 slices shown]
[im 1/17]
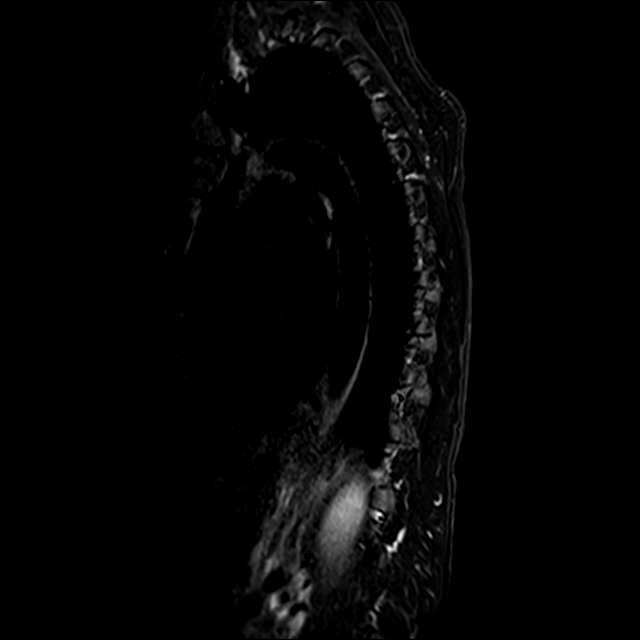
[im 4/17]
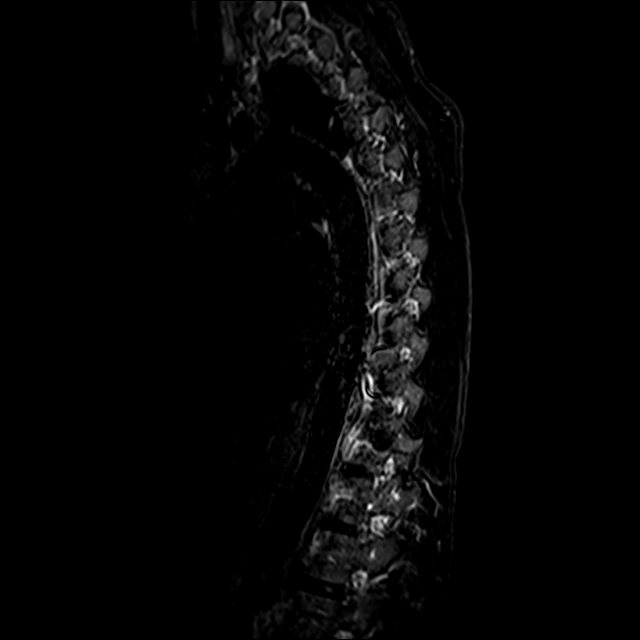
[im 7/17]
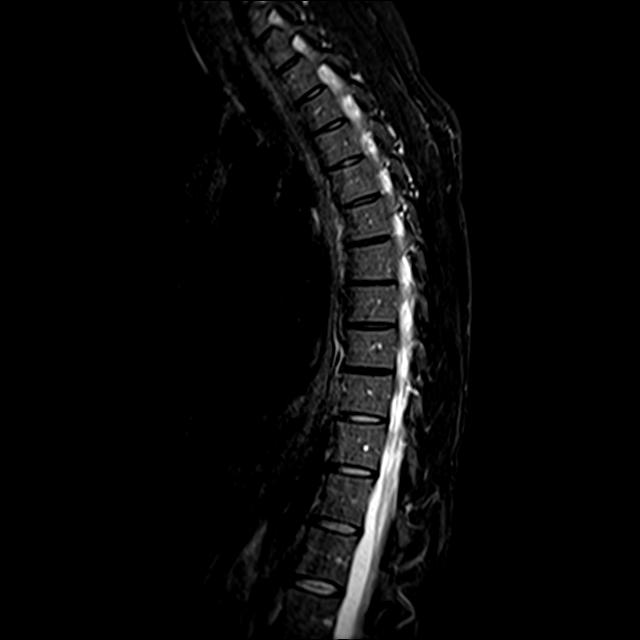
[im 10/17]
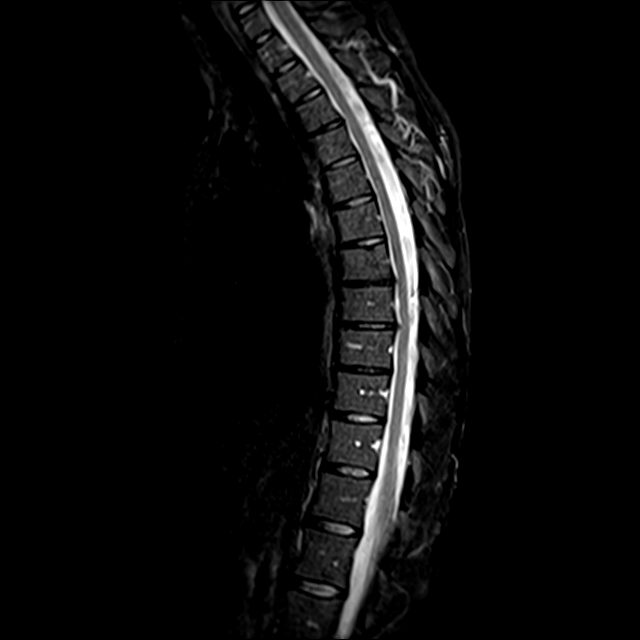
[im 13/17]
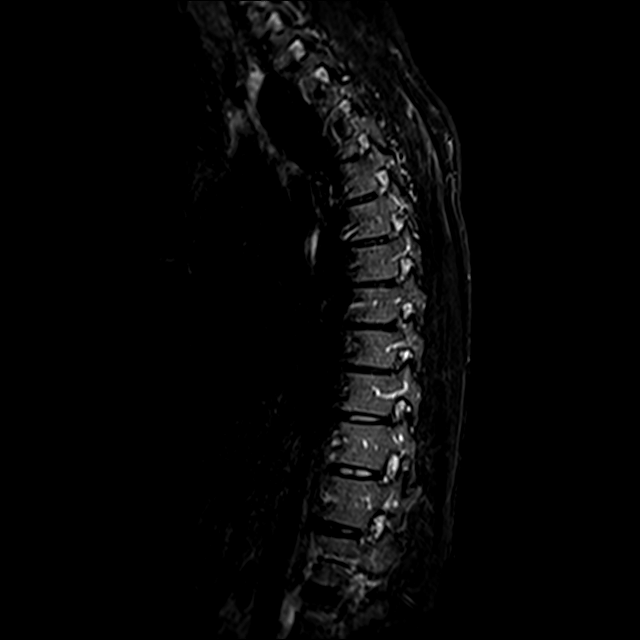
[im 17/17]
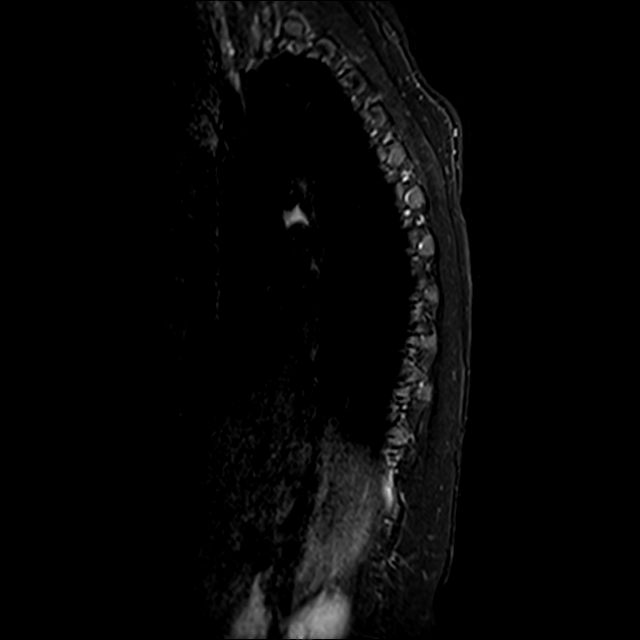

[Series 20: T2 · axial · 4.0mm · 0.59mm/px · z∈[-278,-76]mm · 8 of 39 slices shown (2 of 2)]
[im 1/39]
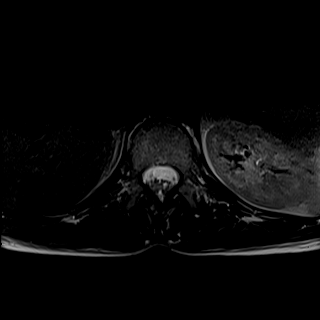
[im 6/39]
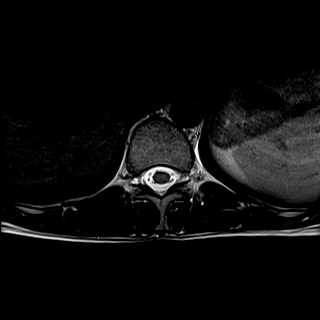
[im 12/39]
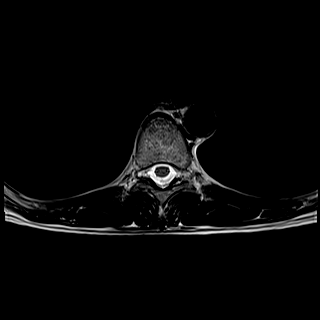
[im 18/39]
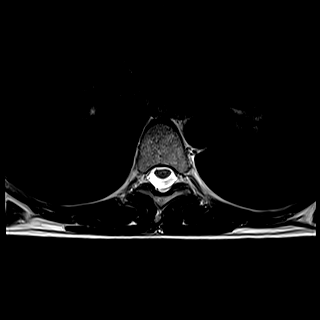
[im 21/39]
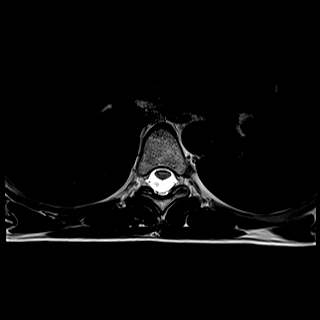
[im 27/39]
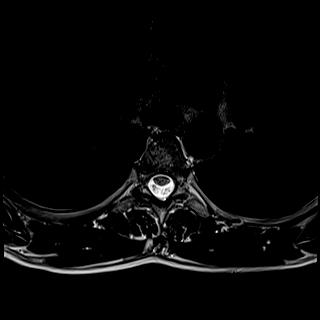
[im 33/39]
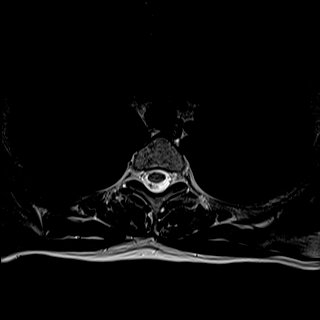
[im 39/39]
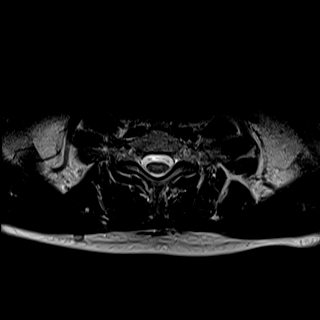

[Series 21: GRE · axial · 4.0mm · 0.37mm/px · z∈[-278,-236]mm · 2 of 39 slices shown]
[im 1/39]
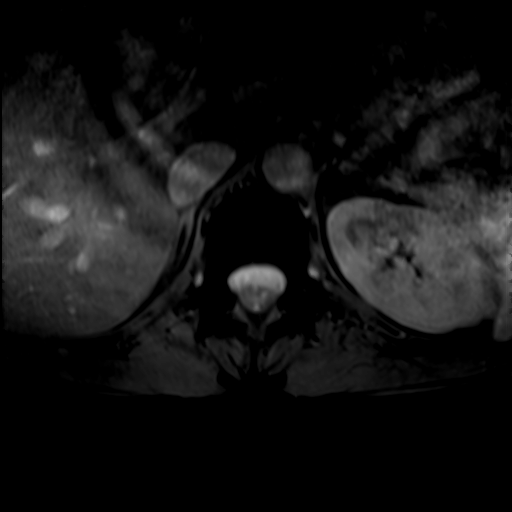
[im 6/39]
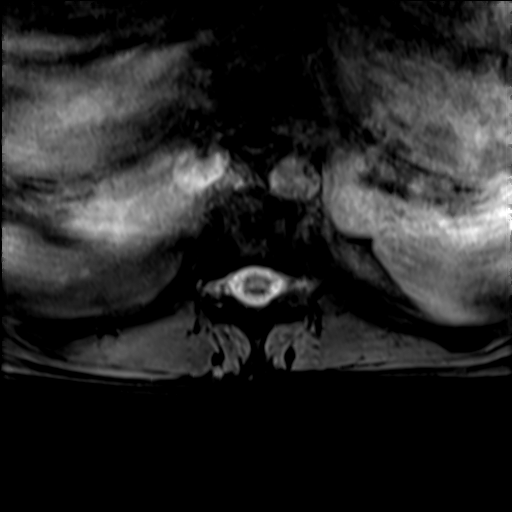

[30 of 48 positions shown; findings below may reference images not displayed]

FINDINGS: Alignment:  Normal.

Vertebrae: No fracture, suspicious osseous lesion, or significant
marrow edema.

Cord:  Normal signal.

Paraspinal and other soft tissues: Unremarkable.

Disc levels:

Small right paracentral disc protrusions at T7-8 and T8-9 contact
and slightly deform the spinal cord without significant generalized
spinal stenosis. The neural foramina are widely patent throughout.
IMPRESSION: Small right paracentral disc protrusions at T7-8 and T8-9 mildly
indenting the cord.

## 2020-07-28 DIAGNOSIS — G894 Chronic pain syndrome: Secondary | ICD-10-CM | POA: Diagnosis not present

## 2020-07-28 DIAGNOSIS — Z79899 Other long term (current) drug therapy: Secondary | ICD-10-CM | POA: Diagnosis not present

## 2020-07-28 DIAGNOSIS — M47814 Spondylosis without myelopathy or radiculopathy, thoracic region: Secondary | ICD-10-CM | POA: Diagnosis not present

## 2020-08-23 ENCOUNTER — Telehealth: Payer: Self-pay

## 2020-08-23 ENCOUNTER — Telehealth: Payer: Medicaid Other | Admitting: Physician Assistant

## 2020-08-23 ENCOUNTER — Encounter: Payer: Self-pay | Admitting: Physician Assistant

## 2020-08-23 ENCOUNTER — Other Ambulatory Visit: Payer: Medicaid Other

## 2020-08-23 DIAGNOSIS — J0110 Acute frontal sinusitis, unspecified: Secondary | ICD-10-CM

## 2020-08-23 DIAGNOSIS — Z20822 Contact with and (suspected) exposure to covid-19: Secondary | ICD-10-CM | POA: Diagnosis not present

## 2020-08-23 MED ORDER — DOXYCYCLINE HYCLATE 100 MG PO CAPS: 100.0000 mg | ORAL_CAPSULE | Freq: Two times a day (BID) | ORAL | 0 refills | Status: DC

## 2020-08-23 MED ORDER — ALBUTEROL SULFATE HFA 108 (90 BASE) MCG/ACT IN AERS: 1.0000 | INHALATION_SPRAY | Freq: Four times a day (QID) | RESPIRATORY_TRACT | 0 refills | Status: DC | PRN

## 2020-08-23 MED ORDER — FLUCONAZOLE 150 MG PO TABS: 150.0000 mg | ORAL_TABLET | Freq: Once | ORAL | 0 refills | Status: AC

## 2020-08-23 NOTE — Telephone Encounter (Signed)
Copied from CRM [id number]. Topic: General - Other >> Aug 23, 2020 10:17 AM Jaquita Rector A wrote: Reason for CRM: Patient called in to get an appointment for today has sinus pressure and some other cold and flu symptoms. No appointment available until 08/31/20 please advise. Ph# [phone]

## 2020-08-23 NOTE — Telephone Encounter (Signed)
I called and spoke with the patient and her partner they both are complaining of flu like symptoms x 6 days. She informed me that she did a COVID test on today. Pending results. She called requesting a virtual appt today. I gave the patient the COVID-19 care options link to schedule a free E-visit or video visit with a [facility] Provider. The pt verbalize understanding.

## 2020-08-23 NOTE — Patient Instructions (Signed)
Instructions sent to patients MyChart.

## 2020-08-23 NOTE — Progress Notes (Signed)
Ms. Veronica Lyons are scheduled for a virtual visit with your provider today.    Just as we do with appointments in the office, we must obtain your consent to participate.  Your consent will be active for this visit and any virtual visit you may have with one of our providers in the next 365 days.    If you have a MyChart account, I can also send a copy of this consent to you electronically.  All virtual visits are billed to your insurance company just like a traditional visit in the office.  As this is a virtual visit, video technology does not allow for your provider to perform a traditional examination.  This may limit your provider's ability to fully assess your condition.  If your provider identifies any concerns that need to be evaluated in person or the need to arrange testing such as labs, EKG, etc, we will make arrangements to do so.    Although advances in technology are sophisticated, we cannot ensure that it will always work on either your end or our end.  If the connection with a video visit is poor, we may have to switch to a telephone visit.  With either a video or telephone visit, we are not always able to ensure that we have a secure connection.   I need to obtain your verbal consent now.   Are you willing to proceed with your visit today?   Veronica Lyons has provided verbal consent on 08/23/2020 for a virtual visit (video or telephone).   [name], PA-C 08/23/2020  10:41 AM     Virtual Visit via Video   I connected with patient on 08/23/20 at 10:45 AM EST by a video enabled telemedicine application and verified that I am speaking with the correct person using two identifiers.  Location patient: Home Location provider: [facility] Persons participating in the virtual visit: Patient, Provider  I discussed the limitations of evaluation and management by telemedicine and the availability of in person appointments. The patient expressed understanding and  agreed to proceed.  Subjective:   HPI:   Patient presents via Care[facility] today greater than 1 week of URI symptoms.  Notes last Monday she started feeling "really crummy" with severe fatigue, sinus congestion, sinus pressure and headaches.  Denies fever but noted some initial chills that resolved within the first couple days of illness.  Denies any nausea or vomiting but noticed anorexia that is now resolved.  Notes fatigue itself is resolved.  Denies loss of taste or smell.  Does note that her kids were all sick but got better very quickly.  Lingering symptoms include chest congestion with cough that is productive of yellow phlegm, frontal sinus pain bilaterally.  Notes she is prone to sinus infections and typically gets these when she gets sick.  Is taking Mucinex and a saline nasal rinse did seem to help somewhat.  Note she did have Covid last November but states this feels different.  Regardless she was Covid tested this morning, results pending.  Note she has not had her vaccine or her flu shot.  ROS:   See pertinent positives and negatives per HPI.  Patient Active Problem List   Diagnosis Date Noted  . Insomnia due to medical condition 10/22/2018  . Osteoarthritis of spine with radiculopathy, cervical region 09/11/2018  . PTSD (post-traumatic stress disorder) 09/10/2018    Social History   Tobacco Use  . Smoking status: Current Every Day Smoker  Packs/day: 1.00    Years: 20.00    Pack years: 20.00    Types: Cigarettes  . Smokeless tobacco: Never Used  . Tobacco comment: since age 5  Substance Use Topics  . Alcohol use: Yes    Alcohol/week: 3.0 standard drinks    Types: 3 Cans of beer per week    Current Outpatient Medications:  .  albuterol (VENTOLIN HFA) 108 (90 Base) MCG/ACT inhaler, Inhale into the lungs every 6 (six) hours as needed for wheezing or shortness of breath., Disp: , Rfl:  .  amphetamine-dextroamphetamine (ADDERALL) 10 MG tablet, Take 10 mg by mouth 3  (three) times daily., Disp: , Rfl:  .  aspirin-acetaminophen-caffeine (EXCEDRIN MIGRAINE) 250-250-65 MG tablet, Take by mouth every 6 (six) hours as needed for headache., Disp: , Rfl:  .  busPIRone (BUSPAR) 7.5 MG tablet, Take 1 tablet (7.5 mg total) by mouth 2 (two) times daily. (Patient not taking: Reported on 10/22/2019), Disp: 180 tablet, Rfl: 2 .  cefdinir (OMNICEF) 300 MG capsule, Take 1 capsule (300 mg total) by mouth 2 (two) times daily., Disp: 20 capsule, Rfl: 0 .  DULoxetine (CYMBALTA) 60 MG capsule, Take 1 capsule (60 mg total) by mouth daily. (Patient not taking: Reported on 07/29/2019), Disp: 30 capsule, Rfl: 5 .  fluticasone (FLONASE) 50 MCG/ACT nasal spray, USE 2 SPRAYS IN EACH NOSTRIL DAILY; USE FOR 4 TO 6 WEEKS THEN STOP AND USE SEASONALLY OR AS NEEDED, Disp: 16 g, Rfl: 3 .  gabapentin (NEURONTIN) 300 MG capsule, Take 300 mg by mouth 3 (three) times daily., Disp: , Rfl:  .  lisdexamfetamine (VYVANSE) 50 MG capsule, Take 50 mg by mouth daily. (Patient not taking: Reported on 12/30/2019), Disp: , Rfl:  .  ondansetron (ZOFRAN ODT) 4 MG disintegrating tablet, Take 1 tablet (4 mg total) by mouth every 8 (eight) hours as needed for nausea or vomiting., Disp: 30 tablet, Rfl: 0 .  oxyCODONE-acetaminophen (PERCOCET/ROXICET) 5-325 MG tablet, Take 1-2 tablets by mouth every 6 (six) hours as needed for severe pain., Disp: 40 tablet, Rfl: 0 .  predniSONE (STERAPRED UNI-PAK 21 TAB) 10 MG (21) TBPK tablet, Sterapred DS 6 day taper. Take as directed., Disp: 21 tablet, Rfl: 0 .  pseudoephedrine (SUDAFED) 60 MG tablet, Take 60 mg by mouth every 4 (four) hours as needed for congestion., Disp: , Rfl:  .  tiZANidine (ZANAFLEX) 2 MG tablet, Take 2 mg by mouth 2 (two) times daily. One tablet in twice daily and two at bedtime., Disp: , Rfl:  .  traZODone (DESYREL) 100 MG tablet, Take 100 mg by mouth at bedtime., Disp: , Rfl:   Allergies  Allergen Reactions  . Toradol [Ketorolac Tromethamine] Other (See  Comments)    Sensation of deep skin burning.   . Morphine Other (See Comments)    "Loopy" without relief of pain    Objective:   There were no vitals taken for this visit.  Patient is well-developed, well-nourished in no acute distress.  Resting comfortably at home.  Head is normocephalic, atraumatic.  No labored breathing.  Speech is clear and coherent with logical content.  Patient is alert and oriented at baseline.   Assessment and Plan:   1. Encounter by telehealth for suspected COVID-19 Patient went for testing this morning.  Results pending.  Discussed she should quarantine until results are back.  By the time her results are back within 48 hours, if positive she would be out of her quarantine.  Discussed masking and social distancing practices.  Some occasional windedness with exertion.  None short of breath during visit.  Will refill her albuterol.  Patient enrolled in Covid monitoring program so we can keep a daily check on her.  Strict ER precautions reviewed.  Patient voiced understanding and agreement with the plan. - MyChart COVID-19 home monitoring program; Future - albuterol (VENTOLIN HFA) 108 (90 Base) MCG/ACT inhaler; Inhale 1-2 puffs into the lungs every 6 (six) hours as needed for wheezing or shortness of breath.  Dispense: 8 g; Refill: 0  2. Acute non-recurrent frontal sinusitis Concerned giving level of facial pain.  We will have her resume Flonase.  Continue saline nasal rinses.  Rx doxycycline 100 mg twice daily x10 days.  1 tablet Diflucan given as she has a history of yeast infection with antibiotic use. - doxycycline (VIBRAMYCIN) 100 MG capsule; Take 1 capsule (100 mg total) by mouth 2 (two) times daily.  Dispense: 20 capsule; Refill: 0 - fluconazole (DIFLUCAN) 150 MG tablet; Take 1 tablet (150 mg total) by mouth once for 1 dose.  Dispense: 1 tablet; Refill: 0    [name], [place] 08/23/2020

## 2020-08-24 LAB — SARS-COV-2, NAA 2 DAY TAT

## 2020-08-24 LAB — NOVEL CORONAVIRUS, NAA: SARS-CoV-2, NAA: NOT DETECTED

## 2020-09-23 DIAGNOSIS — F4312 Post-traumatic stress disorder, chronic: Secondary | ICD-10-CM | POA: Diagnosis not present

## 2020-09-23 DIAGNOSIS — F331 Major depressive disorder, recurrent, moderate: Secondary | ICD-10-CM | POA: Diagnosis not present

## 2020-09-23 DIAGNOSIS — F902 Attention-deficit hyperactivity disorder, combined type: Secondary | ICD-10-CM | POA: Diagnosis not present

## 2020-09-29 DIAGNOSIS — Z79899 Other long term (current) drug therapy: Secondary | ICD-10-CM | POA: Diagnosis not present

## 2020-09-29 DIAGNOSIS — M47814 Spondylosis without myelopathy or radiculopathy, thoracic region: Secondary | ICD-10-CM | POA: Diagnosis not present

## 2020-09-29 DIAGNOSIS — G894 Chronic pain syndrome: Secondary | ICD-10-CM | POA: Diagnosis not present

## 2020-12-29 DIAGNOSIS — G894 Chronic pain syndrome: Secondary | ICD-10-CM | POA: Diagnosis not present

## 2020-12-29 DIAGNOSIS — Z79899 Other long term (current) drug therapy: Secondary | ICD-10-CM | POA: Diagnosis not present

## 2020-12-29 DIAGNOSIS — M47814 Spondylosis without myelopathy or radiculopathy, thoracic region: Secondary | ICD-10-CM | POA: Diagnosis not present

## 2021-01-19 DIAGNOSIS — F4312 Post-traumatic stress disorder, chronic: Secondary | ICD-10-CM | POA: Diagnosis not present

## 2021-01-19 DIAGNOSIS — F331 Major depressive disorder, recurrent, moderate: Secondary | ICD-10-CM | POA: Diagnosis not present

## 2021-01-19 DIAGNOSIS — F902 Attention-deficit hyperactivity disorder, combined type: Secondary | ICD-10-CM | POA: Diagnosis not present

## 2021-01-19 DIAGNOSIS — Z79899 Other long term (current) drug therapy: Secondary | ICD-10-CM | POA: Diagnosis not present

## 2021-02-11 ENCOUNTER — Encounter: Payer: Self-pay | Admitting: Family Medicine

## 2021-02-11 ENCOUNTER — Other Ambulatory Visit: Payer: Self-pay

## 2021-02-11 ENCOUNTER — Ambulatory Visit (INDEPENDENT_AMBULATORY_CARE_PROVIDER_SITE_OTHER): Payer: Medicaid Other | Admitting: Family Medicine

## 2021-02-11 VITALS — BP 136/70 | HR 95 | Ht 65.0 in | Wt 151.0 lb

## 2021-02-11 DIAGNOSIS — G8929 Other chronic pain: Secondary | ICD-10-CM

## 2021-02-11 DIAGNOSIS — R102 Pelvic and perineal pain: Secondary | ICD-10-CM | POA: Diagnosis not present

## 2021-02-11 DIAGNOSIS — K2950 Unspecified chronic gastritis without bleeding: Secondary | ICD-10-CM

## 2021-02-11 DIAGNOSIS — R110 Nausea: Secondary | ICD-10-CM | POA: Diagnosis not present

## 2021-02-11 DIAGNOSIS — R1032 Left lower quadrant pain: Secondary | ICD-10-CM

## 2021-02-11 DIAGNOSIS — R1031 Right lower quadrant pain: Secondary | ICD-10-CM | POA: Diagnosis not present

## 2021-02-11 DIAGNOSIS — N856 Intrauterine synechiae: Secondary | ICD-10-CM

## 2021-02-11 MED ORDER — OMEPRAZOLE 40 MG PO CPDR: 40.0000 mg | DELAYED_RELEASE_CAPSULE | Freq: Every day | ORAL | 2 refills | Status: DC

## 2021-02-11 MED ORDER — DICYCLOMINE HCL 10 MG PO CAPS: 10.0000 mg | ORAL_CAPSULE | Freq: Three times a day (TID) | ORAL | 2 refills | Status: AC

## 2021-02-11 MED ORDER — SUCRALFATE 1 G PO TABS: 1.0000 g | ORAL_TABLET | Freq: Three times a day (TID) | ORAL | 1 refills | Status: DC

## 2021-02-11 MED ORDER — ONDANSETRON 4 MG PO TBDP: 4.0000 mg | ORAL_TABLET | Freq: Three times a day (TID) | ORAL | 2 refills | Status: DC | PRN

## 2021-02-11 NOTE — Patient Instructions (Addendum)
Thank you for coming to the office today.  Topical anesthetic   Referral to GI - stay tuned for apt.  [facility]) [address] [place], [place] [address] Phone: [phone]  [facility]) [address] [place], [place] [address] Main: [phone]  -----------  GYN referral for Pelvic Pain.  [facility] [address] [place], [place] [address] Hours: Lewayne Bunting Main: [phone]  [facility]   Address: [address], [place], [place] [address], [place], [place] [address] Hours: 8AM-5PM Phone: [phone]   Please schedule a Follow-up Appointment to: Return if symptoms worsen or fail to improve.  If you have any other questions or concerns, please feel free to call the office or send a message through MyChart. You may also schedule an earlier appointment if necessary.  Additionally, you may be receiving a survey about your experience at our office within a few days to 1 week by e-mail or mail. We value your feedback.  [name], DO [facility], [place]

## 2021-02-11 NOTE — Progress Notes (Signed)
Subjective:    Patient ID: Veronica Lyons, female    DOB: 06-May-1981, 40 y.o.   MRN: [id number]  Veronica Lyons is a 40 y.o. female presenting on 02/11/2021 for Abdominal Pain   HPI  R Lower Abdominal vs Pelvic Pain Incisional Scar Hypersensitivity Abdominal Symptoms Constipation  Reports persistent pain for >8 months deeper lower R abdomen  Describes Right pelvic pain, onset worse after sexual intercourse or during intercourse  Admits episodic symptoms with eating twice a day, can get sweaty dizzy and nauseas worse when eats potatoes (starchy) - usually 5 min after eating anything.  Previously on NSAIDs PRN  Admits hyperactive stomach  Admits C-section scar incisional pain R sided around  Pain Management by Gay Filler NP at [facility] She takes Gabapentin 300mg  QID, Tizanidine 2mg  BID, Tramadol 50mg  q 6 hr PRN  History of Barium swallow as child. She has never had Endoscopy and Colonoscopy.  Admits bowel movements have changed with abnormal color and odor, describes orange to yellow stool, and some narrow ribbons or stool caliber change  Admits constipation, less hydration and on pain medication  Some increased alcohol intake but the symptoms were onset earlier.  Admits some history of traumatic losses in family, ex husband and ex father in law.    Depression screen [facility] 2/9 02/11/2021 10/22/2019 07/29/2019  Decreased Interest 2 0 3  Down, Depressed, Hopeless 2 0 2  PHQ - 2 Score 4 0 5  Altered sleeping 1 - 3  Tired, decreased energy 1 - 2  Change in appetite 1 - 2  Feeling bad or failure about yourself  2 - 2  Trouble concentrating 1 - 3  Moving slowly or fidgety/restless 0 - 3  Suicidal thoughts 0 - 1  PHQ-9 Score 10 - 21  Difficult doing work/chores Very difficult - Extremely dIfficult    Social History   Tobacco Use   Smoking status: Every Day    Packs/day: 1.00    Years: 20.00    Pack years: 20.00    Types: Cigarettes   Smokeless tobacco: Never    Tobacco comments:    since age 50  Vaping Use   Vaping Use: Never used  Substance Use Topics   Alcohol use: Yes    Alcohol/week: 3.0 standard drinks    Types: 3 Cans of beer per week   Drug use: Never    Review of Systems Per HPI unless specifically indicated above     Objective:    BP 136/70   Pulse 95   Ht 5\' 5"  (1.651 m)   Wt 151 lb (68.5 kg)   SpO2 99%   BMI 25.13 kg/m   Wt Readings from Last 3 Encounters:  02/11/21 151 lb (68.5 kg)  01/06/20 154 lb (69.9 kg)  09/10/18 138 lb (62.6 kg)    Physical Exam Vitals and nursing note reviewed.  Constitutional:      General: She is not in acute distress.    Appearance: She is well-developed. She is not diaphoretic.     Comments: Well-appearing, comfortable, cooperative  HENT:     Head: Normocephalic and atraumatic.  Eyes:     General:        Right eye: No discharge.        Left eye: No discharge.     Conjunctiva/sclera: Conjunctivae normal.  Neck:     Thyroid: No thyromegaly.  Cardiovascular:     Rate and Rhythm: Normal rate and regular rhythm.     Heart sounds:  Normal heart sounds. No murmur heard. Pulmonary:     Effort: Pulmonary effort is normal. No respiratory distress.     Breath sounds: Normal breath sounds. No wheezing or rales.  Abdominal:     General: Bowel sounds are increased. There is no distension.     Palpations: Abdomen is soft.     Tenderness: There is abdominal tenderness in the right upper quadrant and right lower quadrant. There is no right CVA tenderness, left CVA tenderness, guarding or rebound. Negative signs include Murphy's sign and McBurney's sign.  Musculoskeletal:        General: Normal range of motion.     Cervical back: Normal range of motion and neck supple.  Lymphadenopathy:     Cervical: No cervical adenopathy.  Skin:    General: Skin is warm and dry.     Findings: No erythema or rash.  Neurological:     Mental Status: She is alert and oriented to person, place, and time.   Psychiatric:        Behavior: Behavior normal.     Comments: Well groomed, good eye contact, normal speech and thoughts   Results for orders placed or performed in visit on 08/23/20  Novel Coronavirus, NAA ([facility])   Specimen: Nasopharyngeal(NP) swabs in vial transport medium   Nasopharynge  Screenin  Result Value Ref Range   SARS-CoV-2, NAA Not Detected Not Detected  SARS-COV-2, NAA 2 DAY TAT   Nasopharynge  Screenin  Result Value Ref Range   SARS-CoV-2, NAA 2 DAY TAT Performed       Assessment & Plan:   Problem List Items Addressed This Visit   None Visit Diagnoses     Pelvic pain in female    -  Primary   Relevant Orders   Ambulatory referral to Obstetrics / Gynecology   Chronic bilateral lower abdominal pain       Relevant Medications   dicyclomine (BENTYL) 10 MG capsule   Other Relevant Orders   Ambulatory referral to Gastroenterology   Asherman's syndrome       Relevant Orders   Ambulatory referral to Obstetrics / Gynecology   Chronic gastritis without bleeding, unspecified gastritis type       Relevant Medications   omeprazole (PRILOSEC) 40 MG capsule   sucralfate (CARAFATE) 1 g tablet   Other Relevant Orders   Ambulatory referral to Gastroenterology   Nausea       Relevant Medications   ondansetron (ZOFRAN ODT) 4 MG disintegrating tablet   Other Relevant Orders   Ambulatory referral to Gastroenterology       chronic now >8 months RLQ and some generalized abdominal pain, also has RUQ symptoms, with digestive functional symptoms postprandial nausea, dizziness, stool caliber and color change. Has pelvic pain and will be seen by GYN as well.  chronic >8 months constellation of GI vs Pelvic symptoms with RLQ pelvic pain, pain with sexual intercourse, history of C-section with scar tissue, asherman's syndrome history. Will also refer to GI for her functional GI symptoms and bowel concerns.  Start symptomatic relief medications - Zofran PRN nausea Omeprazole  40mg  daily x 1-2 months for suspected gastritis functional symptoms, can taper down if need, may have PUD as cause, can be stress related and possibly alcohol triggered gastritis  Dicyclomine PRN abdominal cramping pain has helped before Sucralfate PRN  Referrals ordered today. Follow up on status of appointment. Discussed that pending date of upcoming new patient appointments, we may be able to do further initial testing with  labs / imaging CT or pelvic ultrasound as indicated. If she is able to get into specialists sooner, will defer broad work up prior to scheduling those appointments. Anticipate may need to do initial lab panel and or imaging as indicated.  Will follow-up with patient within 1 week once apt scheduled.  Meds ordered this encounter  Medications   ondansetron (ZOFRAN ODT) 4 MG disintegrating tablet    Sig: Take 1 tablet (4 mg total) by mouth every 8 (eight) hours as needed for nausea or vomiting.    Dispense:  60 tablet    Refill:  2   omeprazole (PRILOSEC) 40 MG capsule    Sig: Take 1 capsule (40 mg total) by mouth daily before breakfast. For up to 2-3 months, may taper down on it if able in future.    Dispense:  30 capsule    Refill:  2   sucralfate (CARAFATE) 1 g tablet    Sig: Take 1 tablet (1 g total) by mouth 4 (four) times daily -  with meals and at bedtime. As needed for upset stomach, abdominal pain, nausea    Dispense:  30 tablet    Refill:  1   dicyclomine (BENTYL) 10 MG capsule    Sig: Take 1 capsule (10 mg total) by mouth 4 (four) times daily -  before meals and at bedtime. As needed    Dispense:  60 capsule    Refill:  2      Follow up plan: Return if symptoms worsen or fail to improve.  [name], DO [facility] 02/11/2021, 12:44 PM

## 2021-02-14 ENCOUNTER — Telehealth: Payer: Self-pay

## 2021-02-14 ENCOUNTER — Encounter: Payer: Self-pay | Admitting: *Deleted

## 2021-02-14 NOTE — Telephone Encounter (Signed)
Veronica Lyons medical referring for Pelvic pain in female, Veronica Lyons with Veronica Lyons. Patient's phone on file was unavailable unable to leave message.

## 2021-03-01 ENCOUNTER — Encounter: Payer: Medicaid Other | Admitting: Obstetrics and Gynecology

## 2021-03-09 DIAGNOSIS — Z5181 Encounter for therapeutic drug level monitoring: Secondary | ICD-10-CM | POA: Diagnosis not present

## 2021-03-09 DIAGNOSIS — Z79899 Other long term (current) drug therapy: Secondary | ICD-10-CM | POA: Diagnosis not present

## 2021-03-09 DIAGNOSIS — Z79891 Long term (current) use of opiate analgesic: Secondary | ICD-10-CM | POA: Diagnosis not present

## 2021-03-09 DIAGNOSIS — M47814 Spondylosis without myelopathy or radiculopathy, thoracic region: Secondary | ICD-10-CM | POA: Diagnosis not present

## 2021-03-09 DIAGNOSIS — G894 Chronic pain syndrome: Secondary | ICD-10-CM | POA: Diagnosis not present

## 2021-05-24 ENCOUNTER — Telehealth: Payer: Self-pay

## 2021-05-24 DIAGNOSIS — G4701 Insomnia due to medical condition: Secondary | ICD-10-CM

## 2021-05-24 DIAGNOSIS — F4310 Post-traumatic stress disorder, unspecified: Secondary | ICD-10-CM

## 2021-05-24 DIAGNOSIS — R41840 Attention and concentration deficit: Secondary | ICD-10-CM

## 2021-05-24 MED ORDER — AMPHETAMINE-DEXTROAMPHETAMINE 20 MG PO TABS: 20.0000 mg | ORAL_TABLET | Freq: Two times a day (BID) | ORAL | 0 refills | Status: DC

## 2021-05-24 MED ORDER — TRAZODONE HCL 100 MG PO TABS: 100.0000 mg | ORAL_TABLET | Freq: Every day | ORAL | 0 refills | Status: DC

## 2021-05-24 NOTE — Telephone Encounter (Signed)
Called patient. She was discharged from [facility] due to 3 no show apt in 2 years, she was unaware of the missed apt and called for refill and she was discharged, no 30 day refill was allowed to her.  She is due for med, and not ideal to be discontinued. I will agree to continue the current rx as it is well documented, and will proceed w/ Adderall 20mg  IR BID #60 for 30 days, and Trazodone 100mg  [facility] 30 day rx and she will find new Psychiatry or follow up with me within 30 days for further plan / refills  , DO [facility] 05/24/2021, 5:45 PM

## 2021-05-24 NOTE — Telephone Encounter (Signed)
Copied from CRM [id number]. Topic: General - Inquiry >> May 24, 2021 10:16 AM Daphine Deutscher D wrote: Reason for CRM: Pt called saying that [facility] has dismissed her from their practice for no show.  She is needing refills on her adderall 20 mg 3 times a day and trazodone 100 mg  She is working getting a new psy provider but needs refills in th mean time.  CB#  [phone]

## 2021-05-26 ENCOUNTER — Telehealth: Payer: Self-pay

## 2021-05-26 DIAGNOSIS — R41840 Attention and concentration deficit: Secondary | ICD-10-CM

## 2021-05-26 MED ORDER — AMPHETAMINE-DEXTROAMPHETAMINE 20 MG PO TABS: 20.0000 mg | ORAL_TABLET | Freq: Two times a day (BID) | ORAL | 0 refills | Status: DC

## 2021-05-26 NOTE — Telephone Encounter (Signed)
Rx sent to [facility] as requested  [name], DO [facility] 05/26/2021, 10:50 AM

## 2021-05-26 NOTE — Telephone Encounter (Signed)
Copied from CRM [id number]. Topic: Quick Communication - Rx Refill/Question >> May 25, 2021  5:04 PM P[place]lus, Maxine Glenn A wrote: Medication: [facility] pharmacist called stating they are unable to get or fill amphetamine-dextroamphetamine (ADDERALL) 20 MG tablet, caller stated the pt requested a new Rx be sent over to [facility] in [place]

## 2021-06-08 DIAGNOSIS — G894 Chronic pain syndrome: Secondary | ICD-10-CM | POA: Diagnosis not present

## 2021-06-08 DIAGNOSIS — Z79899 Other long term (current) drug therapy: Secondary | ICD-10-CM | POA: Diagnosis not present

## 2021-06-08 DIAGNOSIS — M47814 Spondylosis without myelopathy or radiculopathy, thoracic region: Secondary | ICD-10-CM | POA: Diagnosis not present

## 2021-06-10 ENCOUNTER — Telehealth: Payer: Self-pay | Admitting: Family Medicine

## 2021-06-10 DIAGNOSIS — R41840 Attention and concentration deficit: Secondary | ICD-10-CM

## 2021-06-10 MED ORDER — AMPHETAMINE-DEXTROAMPHETAMINE 20 MG PO TABS: 20.0000 mg | ORAL_TABLET | Freq: Two times a day (BID) | ORAL | 0 refills | Status: DC

## 2021-06-10 NOTE — Telephone Encounter (Signed)
Pt states [facility] could only fill a 18 day supply for her of her adderal 20 mg on 11/17. So now she is out and needs either her partial refill or a new Rx for the 60 tabs. Either way, she would appreciate this refilled asap.  The [facility]ill that was sent to [facility] could not be picked up because they have adderal 20 mg on back order. The pharamcy says they will not fill a 10 mg b/c that would deplete their inventory.  Pharmacy says there is a shortage of adderal and they cannot get.  Please send to [id number] - [place] ([place]), [place] [address]

## 2021-06-10 NOTE — Telephone Encounter (Signed)
Adderall sent to pharmacy 

## 2021-06-22 ENCOUNTER — Other Ambulatory Visit: Payer: Self-pay | Admitting: Family Medicine

## 2021-06-22 DIAGNOSIS — G4701 Insomnia due to medical condition: Secondary | ICD-10-CM

## 2021-06-22 DIAGNOSIS — R41840 Attention and concentration deficit: Secondary | ICD-10-CM

## 2021-06-22 DIAGNOSIS — F4310 Post-traumatic stress disorder, unspecified: Secondary | ICD-10-CM

## 2021-06-22 NOTE — Telephone Encounter (Signed)
Medication Refill - Medication: amphetamine-dextroamphetamine (ADDERALL) 20 MG tablet Pt normal [facility] pharmacy is out of stock of this medication / she needs refill for this to go to [facility] in [place]   Has the patient contacted their pharmacy? Yes.   (Agent: If no, request that the patient contact the pharmacy for the refill. If patient does not wish to contact the pharmacy document the reason why and proceed with request.) (Agent: If yes, when and what did the pharmacy advise?) Call pcp for Adderral refill / they sent request for Trazadone   Preferred Pharmacy (with phone number or street name):  [id number] [place], [place] - [address] AT [facility] [address] [facility] Phone:  [phone]  Fax:  [phone]     Has the patient been seen for an appointment in the last year OR does the patient have an upcoming appointment? Yes.    Agent: Please be advised that RX refills may take up to 3 business days. We ask that you follow-up with your pharmacy.

## 2021-06-23 NOTE — Telephone Encounter (Signed)
Requested medication (s) are due for refill today: Yes  Requested medication (s) are on the active medication list: Yes  Last refill:  05/24/21  Future visit scheduled: No  Notes to clinic:  Did pt. Need further follow up?    Requested Prescriptions  Pending Prescriptions Disp Refills   traZODone (DESYREL) 100 MG tablet [Pharmacy Med Name: traZODone HCl 100 MG Oral Tablet] 30 tablet 0    Sig: TAKE 1 TABLET BY MOUTH AT BEDTIME     Psychiatry: Antidepressants - Serotonin Modulator Passed - 06/22/2021  4:09 PM      Passed - Valid encounter within last 6 months    Recent Outpatient Visits           4 months ago Pelvic pain in female   [facility], Netta Neat, DO   1 year ago Chronic sinusitis of both maxillary sinuses   [facility] Smitty Cords, DO   1 year ago Inattention   [facility] Smitty Cords, DO   2 years ago PTSD (post-traumatic stress disorder)   [facility] Smitty Cords, DO   2 years ago PTSD (post-traumatic stress disorder)   [facility], Netta Neat, DO

## 2021-06-23 NOTE — Telephone Encounter (Signed)
Patient called in to inform Dr Kirtland Bouchard that she need an Rx sent to [facility] for the amphetamine-dextroamphetamine (ADDERALL) 20 MG tablet  because Rx from 06/10/21 was a partial refill since the medication is out of stock at [facility] . Please advise

## 2021-06-24 MED ORDER — AMPHETAMINE-DEXTROAMPHETAMINE 20 MG PO TABS: 20.0000 mg | ORAL_TABLET | Freq: Two times a day (BID) | ORAL | 0 refills | Status: DC

## 2021-06-24 NOTE — Telephone Encounter (Signed)
Sent rx Adderall to [facility]  [name], DO [facility] 06/24/2021, 9:13 AM

## 2021-06-24 NOTE — Addendum Note (Signed)
Addended by: Smitty Cords on: 06/24/2021 09:13 AM   Modules accepted: Orders

## 2021-07-16 DIAGNOSIS — H109 Unspecified conjunctivitis: Secondary | ICD-10-CM | POA: Diagnosis not present

## 2021-07-16 DIAGNOSIS — R053 Chronic cough: Secondary | ICD-10-CM | POA: Diagnosis not present

## 2021-07-27 ENCOUNTER — Encounter: Payer: Self-pay | Admitting: Family Medicine

## 2021-07-27 ENCOUNTER — Other Ambulatory Visit: Payer: Self-pay

## 2021-07-27 ENCOUNTER — Ambulatory Visit: Payer: Medicaid Other | Admitting: Family Medicine

## 2021-07-27 VITALS — BP 111/66 | HR 80 | Ht 65.0 in | Wt 150.0 lb

## 2021-07-27 DIAGNOSIS — J9801 Acute bronchospasm: Secondary | ICD-10-CM

## 2021-07-27 DIAGNOSIS — J0110 Acute frontal sinusitis, unspecified: Secondary | ICD-10-CM

## 2021-07-27 DIAGNOSIS — B379 Candidiasis, unspecified: Secondary | ICD-10-CM

## 2021-07-27 DIAGNOSIS — R41840 Attention and concentration deficit: Secondary | ICD-10-CM | POA: Diagnosis not present

## 2021-07-27 DIAGNOSIS — J320 Chronic maxillary sinusitis: Secondary | ICD-10-CM

## 2021-07-27 MED ORDER — FLUTICASONE PROPIONATE 50 MCG/ACT NA SUSP: 2.0000 | Freq: Every day | NASAL | 3 refills | Status: DC

## 2021-07-27 MED ORDER — FLUCONAZOLE 150 MG PO TABS: ORAL_TABLET | ORAL | 1 refills | Status: DC

## 2021-07-27 MED ORDER — ALBUTEROL SULFATE HFA 108 (90 BASE) MCG/ACT IN AERS: 1.0000 | INHALATION_SPRAY | RESPIRATORY_TRACT | 2 refills | Status: DC | PRN

## 2021-07-27 MED ORDER — AMOXICILLIN-POT CLAVULANATE 875-125 MG PO TABS: 1.0000 | ORAL_TABLET | Freq: Two times a day (BID) | ORAL | 0 refills | Status: DC

## 2021-07-27 MED ORDER — AMPHETAMINE-DEXTROAMPHETAMINE 20 MG PO TABS: 20.0000 mg | ORAL_TABLET | Freq: Two times a day (BID) | ORAL | 0 refills | Status: DC

## 2021-07-27 NOTE — Progress Notes (Signed)
Subjective:    Patient ID: Veronica Lyons, female    DOB: [birth year], 41 y.o.   MRN: [id number]  Veronica Lyons is a 41 y.o. female presenting on 07/27/2021 for Cough and Wheezing   HPI  Sinusitis Reports originally had some symptoms around Christmas, with multiple family members at that time all having same illness, febrile as well at that point. No confirmed covid or flu or other viral syndrome, she had pink eye assoc. Symptoms did improve and nearly gone but would have coming and going episodic symptoms return.  Now feeling better overall, no more fever or feeling bad overall. Now worsening sinus symptoms with thicker sinus drainage, foul odor to the nasal discharge, some sinus pain and pressure and abnormal taste in mouth - 1 week ago recent Urgent Care, saw her dx with pink eye and cough URI, she was treated promethazine non codeine syrup, and possibly tessalon perls did not pick up rx due to cost and not preferring to take those meds Taking Mucinex DM regularly still Admits productive cough History of sinus surgery, she does sinus flushing regularly.  ADD / Inattention PTSD Previously followed by [facility] [place]. She is not able to return due to no show apt. She is looking for new Psych provider in future. See previous notes, we have agreed to manage her Adderall, Trazodone. - She is due for new rx Adderall IR 20mg  BID dosing, 60 pills per 30 day, current pharmacy is out of stock, it is on back order, she can [facility] up [facility] will need new rx today   Health Maintenance: Did not receive Flu or COVID19 vaccines  Future mammograms age 39+  Depression screen [facility] 2/9 07/27/2021 02/11/2021 10/22/2019  Decreased Interest 1 2 0  Down, Depressed, Hopeless 1 2 0  PHQ - 2 Score 2 4 0  Altered sleeping 0 1 -  Tired, decreased energy 1 1 -  Change in appetite 1 1 -  Feeling bad or failure about yourself  2 2 -  Trouble concentrating 1 1 -  Moving slowly or  fidgety/restless 0 0 -  Suicidal thoughts 0 0 -  PHQ-9 Score 7 10 -  Difficult doing work/chores Somewhat difficult Very difficult -    Social History   Tobacco Use   Smoking status: Every Day    Packs/day: 1.00    Years: 20.00    Pack years: 20.00    Types: Cigarettes   Smokeless tobacco: Never   Tobacco comments:    since age 89  Vaping Use   Vaping Use: Never used  Substance Use Topics   Alcohol use: Yes    Alcohol/week: 3.0 standard drinks    Types: 3 Cans of beer per week   Drug use: Never    Review of Systems Per HPI unless specifically indicated above     Objective:    BP 111/66    Pulse 80    Ht 5\' 5"  (1.651 m)    Wt 150 lb (68 kg)    SpO2 100%    BMI 24.96 kg/m   Wt Readings from Last 3 Encounters:  07/27/21 150 lb (68 kg)  02/11/21 151 lb (68.5 kg)  01/06/20 154 lb (69.9 kg)    Physical Exam Vitals and nursing note reviewed.  Constitutional:      General: She is not in acute distress.    Appearance: She is well-developed. She is not diaphoretic.     Comments: Well-appearing, comfortable, cooperative  HENT:  Head: Normocephalic and atraumatic.  Eyes:     General:        Right eye: No discharge.        Left eye: No discharge.     Conjunctiva/sclera: Conjunctivae normal.  Neck:     Thyroid: No thyromegaly.  Cardiovascular:     Rate and Rhythm: Normal rate and regular rhythm.     Heart sounds: Normal heart sounds. No murmur heard. Pulmonary:     Effort: Pulmonary effort is normal. No respiratory distress.     Breath sounds: Normal breath sounds. No wheezing or rales.  Musculoskeletal:        General: Normal range of motion.     Cervical back: Normal range of motion and neck supple.  Lymphadenopathy:     Cervical: No cervical adenopathy.  Skin:    General: Skin is warm and dry.     Findings: No erythema or rash.  Neurological:     Mental Status: She is alert and oriented to person, place, and time.  Psychiatric:        Behavior: Behavior  normal.     Comments: Well groomed, good eye contact, normal speech and thoughts   Results for orders placed or performed in visit on 08/23/20  Novel Coronavirus, NAA ([facility])   Specimen: Nasopharyngeal(NP) swabs in vial transport medium   Nasopharynge  Screenin  Result Value Ref Range   SARS-CoV-2, NAA Not Detected Not Detected  SARS-COV-2, NAA 2 DAY TAT   Nasopharynge  Screenin  Result Value Ref Range   SARS-CoV-2, NAA 2 DAY TAT Performed       Assessment & Plan:   Problem List Items Addressed This Visit   None Visit Diagnoses     Acute non-recurrent frontal sinusitis    -  Primary   Relevant Medications   amoxicillin-clavulanate (AUGMENTIN) 875-125 MG tablet   fluticasone (FLONASE) 50 MCG/ACT nasal spray   fluconazole (DIFLUCAN) 150 MG tablet   Bronchospasm, acute       Relevant Medications   fluticasone (FLONASE) 50 MCG/ACT nasal spray   albuterol (VENTOLIN HFA) 108 (90 Base) MCG/ACT inhaler   Chronic sinusitis of both maxillary sinuses       Relevant Medications   amoxicillin-clavulanate (AUGMENTIN) 875-125 MG tablet   fluticasone (FLONASE) 50 MCG/ACT nasal spray   fluconazole (DIFLUCAN) 150 MG tablet   Inattention       Relevant Medications   amphetamine-dextroamphetamine (ADDERALL) 20 MG tablet   Antibiotic-induced yeast infection       Relevant Medications   fluconazole (DIFLUCAN) 150 MG tablet      Consistent with acute frontal sinusitis, likely initially viral URI vs allergic rhinitis component with worsening concern for bacterial infection.   Plan: 1.  Start Augmentin 875-125mg  PO BID x 10 days 2. Start nasal steroid Flonase 2 sprays in each nostril daily for 4-6 weeks, may repeat course seasonally or as needed Return criteria reviewed Add diflucan for potential antibiotic yeast infection   ADD inattention See prior documentation Following her now with medication management here until she can re-establish with new Psychiatry for med management  ADD Will refill Adderall 20mg  IR BID dosing #60 per month 0 refills, f/u with me on message or call within 30 days for next refill Contact clinic via mychart for further Adderall refills right now, issue due to back order, may need to switch pharmacies.   Meds ordered this encounter  Medications   amoxicillin-clavulanate (AUGMENTIN) 875-125 MG tablet    Sig: Take 1 tablet  by mouth 2 (two) times daily.    Dispense:  20 tablet    Refill:  0   fluticasone (FLONASE) 50 MCG/ACT nasal spray    Sig: Place 2 sprays into both nostrils daily. Use for 4-6 weeks then stop and use seasonally or as needed.    Dispense:  16 g    Refill:  3   albuterol (VENTOLIN HFA) 108 (90 Base) MCG/ACT inhaler    Sig: Inhale 1-2 puffs into the lungs every 4 (four) hours as needed for wheezing or shortness of breath.    Dispense:  8 g    Refill:  2   amphetamine-dextroamphetamine (ADDERALL) 20 MG tablet    Sig: Take 1 tablet (20 mg total) by mouth 2 (two) times daily.    Dispense:  60 tablet    Refill:  0   fluconazole (DIFLUCAN) 150 MG tablet    Sig: Take one tablet by mouth on Day 1. Repeat dose 2nd tablet on Day 3.    Dispense:  2 tablet    Refill:  1      Follow up plan: Return in about 6 months (around 01/24/2022).  Saralyn PilarAlexander Reilly Molchan, DO [facility] 07/27/2021, 9:58 AM

## 2021-07-27 NOTE — Patient Instructions (Addendum)
Thank you for coming to the office today.  Treating sinuses with Augmentin for 10 days, and Start nasal steroid Flonase 2 sprays in each nostril daily for 4-6 weeks, may repeat course seasonally or as needed. Also Albuterol rescue inhaler as needed   Rx Ad[facility]all [facility] day to [facility].  DUE for FASTING BLOOD WORK (no food or drink after midnight before the lab appointment, only water or coffee without cream/sugar on the morning of)  In the morning of your apt, after we talk we can draw the blood   Please schedule a Follow-up Appointment to: Return in about 6 months (around 01/24/2022) for 6 month Annual Physical AM fasting lab AFTER .  If you have any other questions or concerns, please feel free to call the office or send a message through MyChart. You may also schedule an earlier appointment if necessary.  Additionally, you may be receiving a survey about your experience at our office within a few days to 1 week by e-mail or mail. We value your feedback.  [name], DO [facility], [place]

## 2021-08-25 ENCOUNTER — Other Ambulatory Visit: Payer: Self-pay | Admitting: Family Medicine

## 2021-08-25 DIAGNOSIS — R41840 Attention and concentration deficit: Secondary | ICD-10-CM

## 2021-08-25 DIAGNOSIS — F4310 Post-traumatic stress disorder, unspecified: Secondary | ICD-10-CM

## 2021-08-25 DIAGNOSIS — G4701 Insomnia due to medical condition: Secondary | ICD-10-CM

## 2021-08-25 NOTE — Telephone Encounter (Signed)
Copied from CRM [id number]. Topic: Quick Communication - Rx Refill/Question >> Aug 25, 2021  9:25 AM Aretta Nip wrote: Medication: amphetamine-dextroamphetamine (ADDERALL) 20 MG tablet 60 tablet 0 07/27/2021   Sig - Route: Take 1 tablet (20 mg total) by mouth 2 (two) times daily. - Oral  Sent to pharmacy as: amphetamine-dextroamphetamine (ADDERALL) 20 MG tablet  Earliest Fill Date: 07/27/2021  E-Prescribing Status: Receipt confirmed by pharmacy (07/27/2021 10:21 AM EST)   Associated Diagnoses  Pls if possible try to get called in by tomorrow morning as they are closed in weekends and they still have in stock! Thanks by Joni Reining  Has the patient contacted their pharmacy? Yes.  In [place]!  (Agent: If no, request that the patient contact the pharmacy for the refill. If patient does not wish to contact the pharmacy document the reason why and proceed with request.) (Agent: If yes, when and what did the pharmacy advise?) Pls process asap as in stock   Preferred Pharmacy (with phone number or street name): [facility], [place] - [address] [place] [address] Phone: [phone] Fax: [phone]   Has the patient been seen for an appointment in the last year OR does the patient have an upcoming appointment? Yes.  07/27/21 last visit   Agent: Please be advised that RX refills may take up to 3 business days. We ask that you follow-up with your pharmacy.

## 2021-08-25 NOTE — Telephone Encounter (Signed)
Veronica Lyons (Patient) Veronica Lyons (Patient) Quick Communication - Rx Refill/Question  Summary: Refill/ wanted as soon as possivle due to in stock/ pharmacy closed weekends  Medication: amphetamine-dextroamphetamine (ADDERALL) 20 MG tablet 60 tablet 0 07/27/2021   Sig - Route: Take 1 tablet (20 mg total) by mouth 2 (two) times daily. - Oral   Sent to pharmacy as: amphetamine-dextroamphetamine (ADDERALL) 20 MG tablet   Earliest Fill Date: 07/27/2021   E-Prescribing Status: Receipt confirmed by pharmacy (07/27/2021 10:21 AM EST)     Associated Diagnoses    Pls if possible try to get called in by tomorrow morning as they are closed in weekends and they still have in stock! Thanks by Elmyra Ricks    Has the patient contacted their pharmacy? Yes.  In [place]!   (Agent: If no, request that the patient contact the pharmacy for the refill. If patient does not wish to contact the pharmacy document the reason why and proceed with request.)  (Agent: If yes, when and what did the pharmacy advise?) Pls process asap as in stock     Preferred Pharmacy (with phone number or street name): [facility], [place] [address]  [id number] [place] [address]  Phone: [phone] Fax: [phone]

## 2021-08-25 NOTE — Telephone Encounter (Signed)
Requested medication (s) are due for refill today: Yes  Requested medication (s) are on the active medication list: Yes  Last refill:  07/27/21  Future visit scheduled: Yes  Notes to clinic:  Unable to refill per protocol, cannot delegate. Patient requesting it to be sent by tomorrow due to pharmacy closed on weekends.      Requested Prescriptions  Pending Prescriptions Disp Refills   amphetamine-dextroamphetamine (ADDERALL) 20 MG tablet 60 tablet 0    Sig: Take 1 tablet (20 mg total) by mouth 2 (two) times daily.     Not Delegated - Psychiatry:  Stimulants/ADHD Failed - 08/25/2021  9:51 AM      Failed - This refill cannot be delegated      Failed - Urine Drug Screen completed in last 360 days      Passed - Last BP in normal range    BP Readings from Last 1 Encounters:  07/27/21 111/66          Passed - Last Heart Rate in normal range    Pulse Readings from Last 1 Encounters:  07/27/21 80          Passed - Valid encounter within last 6 months    Recent Outpatient Visits           4 weeks ago Acute non-recurrent frontal sinusitis   [facility] [place], Netta Neat, DO   6 months ago Pelvic pain in female   [facility] [place], Netta Neat, DO   1 year ago Chronic sinusitis of both maxillary sinuses   [facility] Smitty Cords, DO   2 years ago Inattention   [facility] Smitty Cords, DO   2 years ago PTSD (post-traumatic stress disorder)   [facility] Althea Charon, Netta Neat, DO       Future Appointments             In 5 months Althea Charon, Netta Neat, DO [facility], [facility]

## 2021-08-26 ENCOUNTER — Other Ambulatory Visit: Payer: Self-pay | Admitting: Family Medicine

## 2021-08-26 DIAGNOSIS — R41840 Attention and concentration deficit: Secondary | ICD-10-CM

## 2021-08-26 MED ORDER — AMPHETAMINE-DEXTROAMPHETAMINE 20 MG PO TABS: 20.0000 mg | ORAL_TABLET | Freq: Two times a day (BID) | ORAL | 0 refills | Status: DC

## 2021-08-26 MED ORDER — TRAZODONE HCL 100 MG PO TABS: 100.0000 mg | ORAL_TABLET | Freq: Every day | ORAL | 1 refills | Status: DC

## 2021-08-26 NOTE — Telephone Encounter (Signed)
Pt following up on refill request.  The only pharmacy she can find this Rx is [facility].  They close at 5 pm, not open on the weekends, she is out of medication. amphetamine-dextroamphetamine (ADDERALL) 20 MG tablet  [facility], [place] - [address]

## 2021-09-01 DIAGNOSIS — G894 Chronic pain syndrome: Secondary | ICD-10-CM | POA: Diagnosis not present

## 2021-09-01 DIAGNOSIS — Z79891 Long term (current) use of opiate analgesic: Secondary | ICD-10-CM | POA: Diagnosis not present

## 2021-09-01 DIAGNOSIS — M47814 Spondylosis without myelopathy or radiculopathy, thoracic region: Secondary | ICD-10-CM | POA: Diagnosis not present

## 2021-09-01 DIAGNOSIS — Z79899 Other long term (current) drug therapy: Secondary | ICD-10-CM | POA: Diagnosis not present

## 2021-09-01 DIAGNOSIS — Z5181 Encounter for therapeutic drug level monitoring: Secondary | ICD-10-CM | POA: Diagnosis not present

## 2021-09-27 ENCOUNTER — Other Ambulatory Visit: Payer: Self-pay | Admitting: Family Medicine

## 2021-09-27 DIAGNOSIS — R41840 Attention and concentration deficit: Secondary | ICD-10-CM

## 2021-09-27 NOTE — Telephone Encounter (Signed)
Medication Refill - Medication: Adderall 20 mg ? ?Has the patient contacted their pharmacy? Yes.  They told her to call the office ?(Agent: If no, request that the patient contact the pharmacy for the refill. If patient does not wish to contact the pharmacy document the reason why and proceed with request.) ?(Agent: If yes, when and what did the pharmacy advise?) ? ?Preferred Pharmacy (with phone number or street name): [facility]   ?Has the patient been seen for an appointment in the last year OR does the patient have an upcoming appointment? Yes.   ? ?Agent: Please be advised that RX refills may take up to 3 business days. We ask that you follow-up with your pharmacy.  ?

## 2021-09-29 NOTE — Telephone Encounter (Signed)
Requested medication (s) are due for refill today: no ? ?Requested medication (s) are on the active medication list: yes ? ?Last refill:  09/23/21 #60 with 0 RF, has this request which is dated 10/21/21 #60 with 0 RF ? ?Future visit scheduled: 01/31/22 ? ?Notes to clinic:  This medication can not be delegated, please assess.  ? ? ? ?  ? ?Requested Prescriptions  ?Pending Prescriptions Disp Refills  ? amphetamine-dextroamphetamine (ADDERALL) 20 MG tablet 60 tablet 0  ?  Sig: Take 1 tablet (20 mg total) by mouth 2 (two) times daily.  ?  ? Not Delegated - Psychiatry:  Stimulants/ADHD Failed - 09/27/2021  5:29 PM  ?  ?  Failed - This refill cannot be delegated  ?  ?  Failed - Urine Drug Screen completed in last 360 days  ?  ?  Passed - Last BP in normal range  ?  BP Readings from Last 1 Encounters:  ?07/27/21 111/66  ?  ?  ?  ?  Passed - Last Heart Rate in normal range  ?  Pulse Readings from Last 1 Encounters:  ?07/27/21 80  ?  ?  ?  ?  Passed - Valid encounter within last 6 months  ?  Recent Outpatient Visits   ? ?      ? 2 months ago Acute non-recurrent frontal sinusitis  ? [facility] [place], Netta Neat, DO  ? 7 months ago Pelvic pain in female  ? [facility], Netta Neat, DO  ? 1 year ago Chronic sinusitis of both maxillary sinuses  ? [facility] [place], Netta Neat, DO  ? 2 years ago Inattention  ? [facility] [place], Netta Neat, DO  ? 2 years ago PTSD (post-traumatic stress disorder)  ? [facility] Smitty Cords, DO  ? ?  ?  ?Future Appointments   ? ?        ? In 4 months Althea Charon, Netta Neat, DO [facility]  ? ?  ? ?  ?  ?  ? ? ?

## 2021-11-23 ENCOUNTER — Other Ambulatory Visit: Payer: Self-pay | Admitting: Family Medicine

## 2021-11-23 DIAGNOSIS — F4310 Post-traumatic stress disorder, unspecified: Secondary | ICD-10-CM

## 2021-11-23 DIAGNOSIS — G4701 Insomnia due to medical condition: Secondary | ICD-10-CM

## 2021-11-23 DIAGNOSIS — R41840 Attention and concentration deficit: Secondary | ICD-10-CM

## 2021-11-23 NOTE — Telephone Encounter (Signed)
Medication Refill - Medication: traZODone (DESYREL) 100 MG tablet ? ?Has the patient contacted their pharmacy? Yes.   ? ?(Agent: If yes, when and what did the pharmacy advise?) Unable to connect with her pharmacy  ? ?Preferred Pharmacy (with phone number or street name):  ? ?[facility] [place], [place] - [address] Phone:  [phone]  ?Fax:  [phone]  ?  ? ?Has the patient been seen for an appointment in the last year OR does the patient have an upcoming appointment? Yes.   ? ?Agent: Please be advised that RX refills may take up to 3 business days. We ask that you follow-up with your pharmacy. ? ?

## 2021-11-23 NOTE — Telephone Encounter (Signed)
Requested medication (s) are due for refill today:For review ? ?Requested medication (s) are on the active medication list: yes   ? ?Last refill: 08/26/21  #90  1 refill ? ?Future visit scheduled yes 01/31/22 ? ?Notes to clinic Pt should have refill available. Unable to reach pharmacy to verify. Please review. Thank you. ? ?Requested Prescriptions  ?Pending Prescriptions Disp Refills  ? traZODone (DESYREL) 100 MG tablet 90 tablet 1  ?  Sig: Take 1 tablet (100 mg total) by mouth at bedtime.  ?  ? Psychiatry: Antidepressants - Serotonin Modulator Passed - 11/23/2021  1:30 PM  ?  ?  Passed - Valid encounter within last 6 months  ?  Recent Outpatient Visits   ? ?      ? 3 months ago Acute non-recurrent frontal sinusitis  ? [facility] [place], Netta Neat, DO  ? 9 months ago Pelvic pain in female  ? [facility] [place], Netta Neat, DO  ? 2 years ago Chronic sinusitis of both maxillary sinuses  ? [facility] [place], Netta Neat, DO  ? 2 years ago Inattention  ? [facility], Netta Neat, DO  ? 2 years ago PTSD (post-traumatic stress disorder)  ? [facility] Smitty Cords, DO  ? ?  ?  ?Future Appointments   ? ?        ? In 2 months Althea Charon, Netta Neat, DO [facility]  ? ?  ? ? ?  ?  ?  ? ? ? ? ?

## 2021-11-23 NOTE — Telephone Encounter (Signed)
Requested medications are due for refill today.  yes ? ?Requested medications are on the active medications list.  yes ? ?Last refill. 10/21/2021 #60 0 refills ? ?Future visit scheduled.   yes ? ?Notes to clinic.  Medication refill is not delegated. ? ? ? ?Requested Prescriptions  ?Pending Prescriptions Disp Refills  ? amphetamine-dextroamphetamine (ADDERALL) 20 MG tablet [Pharmacy Med Name: dextroamphetamine-amphetamine 20 mg tablet] 60 tablet 0  ?  Sig: TAKE ONE TABLET BY MOUTH TWICE DAILY  ?  ? Not Delegated - Psychiatry:  Stimulants/ADHD Failed - 11/23/2021 10:23 AM  ?  ?  Failed - This refill cannot be delegated  ?  ?  Failed - Urine Drug Screen completed in last 360 days  ?  ?  Passed - Last BP in normal range  ?  BP Readings from Last 1 Encounters:  ?07/27/21 111/66  ?   ?  ?  Passed - Last Heart Rate in normal range  ?  Pulse Readings from Last 1 Encounters:  ?07/27/21 80  ?   ?  ?  Passed - Valid encounter within last 6 months  ?  Recent Outpatient Visits   ? ?      ? 3 months ago Acute non-recurrent frontal sinusitis  ? [place], DO  ? 9 months ago Pelvic pain in female  ? [place], DO  ? 2 years ago Chronic sinusitis of both maxillary sinuses  ? [place], DO  ? 2 years ago Inattention  ? [place], DO  ? 2 years ago PTSD (post-traumatic stress disorder)  ? [place], DO  ? ?  ?  ?Future Appointments   ? ?        ? In 2 months [place], Devonne Doughty, DO [facility], [place]  ? ?  ? ? ?  ?  ?  ?  ?

## 2021-11-24 MED ORDER — TRAZODONE HCL 100 MG PO TABS: 100.0000 mg | ORAL_TABLET | Freq: Every day | ORAL | 1 refills | Status: DC

## 2021-11-24 MED ORDER — AMPHETAMINE-DEXTROAMPHETAMINE 20 MG PO TABS: 20.0000 mg | ORAL_TABLET | Freq: Two times a day (BID) | ORAL | 0 refills | Status: DC

## 2021-12-06 DIAGNOSIS — G894 Chronic pain syndrome: Secondary | ICD-10-CM | POA: Diagnosis not present

## 2021-12-06 DIAGNOSIS — M47814 Spondylosis without myelopathy or radiculopathy, thoracic region: Secondary | ICD-10-CM | POA: Diagnosis not present

## 2021-12-06 DIAGNOSIS — Z79899 Other long term (current) drug therapy: Secondary | ICD-10-CM | POA: Diagnosis not present

## 2021-12-23 ENCOUNTER — Telehealth: Payer: Self-pay | Admitting: Family Medicine

## 2021-12-23 DIAGNOSIS — R41840 Attention and concentration deficit: Secondary | ICD-10-CM

## 2021-12-23 MED ORDER — AMPHETAMINE-DEXTROAMPHETAMINE 20 MG PO TABS: 20.0000 mg | ORAL_TABLET | Freq: Two times a day (BID) | ORAL | 0 refills | Status: DC

## 2021-12-23 NOTE — Addendum Note (Signed)
Addended by: Lorre Munroe on: 12/23/2021 03:50 PM   Modules accepted: Orders

## 2021-12-23 NOTE — Telephone Encounter (Signed)
[facility] wont have RX for amphetamine-dextroamphetamine (ADDERALL) 20 MG tablet  for 3 months / they are on a buying freeze/ pt stated that she needs RX sent to [facility] [id number] [place], [place] - [address] Phone:  [phone]  Fax:  [phone]    Please advise / pt goes out of town tomorrow and needs this resent today

## 2021-12-23 NOTE — Telephone Encounter (Signed)
refilled 

## 2022-01-11 DIAGNOSIS — M659 Synovitis and tenosynovitis, unspecified: Secondary | ICD-10-CM | POA: Diagnosis not present

## 2022-01-19 ENCOUNTER — Telehealth: Payer: Self-pay | Admitting: Family Medicine

## 2022-01-19 DIAGNOSIS — R41840 Attention and concentration deficit: Secondary | ICD-10-CM

## 2022-01-19 NOTE — Telephone Encounter (Signed)
Requested medications are due for refill today.  No?  Requested medications are on the active medications list.  yes  Last refill. 01/19/2022 #60 0 refills  Future visit scheduled.   yes  Notes to clinic.  Medication already has a refill on med list for 01/19/2022 #60 0 refills. Refill is not delegated.    Requested Prescriptions  Pending Prescriptions Disp Refills   amphetamine-dextroamphetamine (ADDERALL) 20 MG tablet 60 tablet 0    Sig: Take 1 tablet (20 mg total) by mouth 2 (two) times daily.     Not Delegated - Psychiatry:  Stimulants/ADHD Failed - 01/19/2022  4:16 PM      Failed - This refill cannot be delegated      Failed - Urine Drug Screen completed in last 360 days      Passed - Last BP in normal range    BP Readings from Last 1 Encounters:  07/27/21 111/66         Passed - Last Heart Rate in normal range    Pulse Readings from Last 1 Encounters:  07/27/21 80         Passed - Valid encounter within last 6 months    Recent Outpatient Visits           5 months ago Acute non-recurrent frontal sinusitis   [facility] Smitty Cords, DO   11 months ago Pelvic pain in female   [facility] [place], Netta Neat, DO   2 years ago Chronic sinusitis of both maxillary sinuses   [facility] Smitty Cords, DO   2 years ago Inattention   [facility] Smitty Cords, DO   2 years ago PTSD (post-traumatic stress disorder)   [facility] Althea Charon, Netta Neat, DO       Future Appointments             In 1 week Althea Charon, Netta Neat, DO [facility], [facility]

## 2022-01-19 NOTE — Telephone Encounter (Signed)
Copied from CRM [id number]. Topic: General - Other >> Jan 19, 2022  2:42 PM Everette C wrote: Reason for CRM: Medication Refill - Medication: amphetamine-dextroamphetamine (ADDERALL) 20 MG tablet [[id number]]   Has the patient contacted their pharmacy? Yes.   (Agent: If no, request that the patient contact the pharmacy for the refill. If patient does not wish to contact the pharmacy document the reason why and proceed with request.) (Agent: If yes, when and what did the pharmacy advise?)  Preferred Pharmacy (with phone number or street name): [facility] [address]), [place] - [address] [place]) [place] [address] Phone: [phone] Fax: [phone] Hours: Not open 24 hours  Has the patient been seen for an appointment in the last year OR does the patient have an upcoming appointment? Yes.    Agent: Please be advised that RX refills may take up to 3 business days. We ask that you follow-up with your pharmacy.

## 2022-01-20 NOTE — Telephone Encounter (Signed)
Can you call patient and clarify?  Does she still have refills at [facility]? There is one for July.  And then if not, and if she needs it sent to other pharmacy - is she asking for this to be sent to [facility]?  [name], DO [facility] 01/20/2022, 12:47 PM

## 2022-01-24 MED ORDER — AMPHETAMINE-DEXTROAMPHETAMINE 20 MG PO TABS: 20.0000 mg | ORAL_TABLET | Freq: Two times a day (BID) | ORAL | 0 refills | Status: DC

## 2022-01-24 NOTE — Telephone Encounter (Signed)
Pt stated that medication is back ordered at [facility], and this is why she has not picked up the refill.  Pt is asking to please send a new Rx to [id number] - [place] ([address]), [place] - [address] as they have the medication.   [facility] [address] ([address]), [place] - [address]  [address] [place] [address]) [place] [address]  Phone: [phone] Fax: [phone]  Hours: Not open 24 hours

## 2022-01-24 NOTE — Telephone Encounter (Signed)
Ordered to [employer]  [name], DO [facility] 01/24/2022, 3:08 PM

## 2022-01-31 ENCOUNTER — Other Ambulatory Visit: Payer: Self-pay | Admitting: Family Medicine

## 2022-01-31 ENCOUNTER — Ambulatory Visit (INDEPENDENT_AMBULATORY_CARE_PROVIDER_SITE_OTHER): Payer: Medicaid Other | Admitting: Family Medicine

## 2022-01-31 ENCOUNTER — Encounter: Payer: Self-pay | Admitting: Family Medicine

## 2022-01-31 VITALS — BP 116/79 | HR 66 | Ht 65.0 in | Wt 155.6 lb

## 2022-01-31 DIAGNOSIS — R638 Other symptoms and signs concerning food and fluid intake: Secondary | ICD-10-CM

## 2022-01-31 DIAGNOSIS — Z13220 Encounter for screening for lipoid disorders: Secondary | ICD-10-CM

## 2022-01-31 DIAGNOSIS — G4701 Insomnia due to medical condition: Secondary | ICD-10-CM

## 2022-01-31 DIAGNOSIS — R7309 Other abnormal glucose: Secondary | ICD-10-CM | POA: Diagnosis not present

## 2022-01-31 DIAGNOSIS — Z0000 Encounter for general adult medical examination without abnormal findings: Secondary | ICD-10-CM

## 2022-01-31 DIAGNOSIS — N6314 Unspecified lump in the right breast, lower inner quadrant: Secondary | ICD-10-CM | POA: Diagnosis not present

## 2022-01-31 DIAGNOSIS — Z1159 Encounter for screening for other viral diseases: Secondary | ICD-10-CM | POA: Diagnosis not present

## 2022-01-31 DIAGNOSIS — R41840 Attention and concentration deficit: Secondary | ICD-10-CM

## 2022-01-31 DIAGNOSIS — G5621 Lesion of ulnar nerve, right upper limb: Secondary | ICD-10-CM

## 2022-01-31 MED ORDER — AMPHETAMINE-DEXTROAMPHETAMINE 20 MG PO TABS: 20.0000 mg | ORAL_TABLET | Freq: Two times a day (BID) | ORAL | 0 refills | Status: DC

## 2022-01-31 NOTE — Progress Notes (Signed)
Subjective:    Patient ID: Veronica Lyons, female    DOB: 1981-07-04, 41 y.o.   MRN: [id number]  Veronica Lyons is a 41 y.o. female presenting on 01/31/2022 for Annual Exam   HPI  Here for Annual Physical and   Fam history of Familial Hypercholesterolemia Sisters have high cholesterol but she does not.  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 Asking about further testing / referral  History Asherman's Syndrome Incomplete Emptying Bladder She is not ready to pursue other treatment now, but mentioning this as a component of her history.  ADD / Inattention Adderall 20mg  BID Last ordered 01/24/22 Will need refills for future.  Health Maintenance: Due for screening Hep C lab.  Breast Cancer screening: She has new problem identified with R breast mass / lump under R breast areola nipple 1 x 1 cm mass non tender 5 o clock region. She has not had prior screening mammogram, asking about imaging today  Fam history of Maternal Grandmother breast cancer age 38-60s.     01/31/2022    8:54 AM 07/27/2021    9:42 AM 02/11/2021   11:19 AM  Depression screen PHQ 2/9  Decreased Interest 1 1 2   Down, Depressed, Hopeless 1 1 2   PHQ - 2 Score 2 2 4   Altered sleeping 0 0 1  Tired, decreased energy 1 1 1   Change in appetite 2 1 1   Feeling bad or failure about yourself  1 2 2   Trouble concentrating 0 1 1  Moving slowly or fidgety/restless 0 0 0  Suicidal thoughts 0 0 0  PHQ-9 Score 6 7 10   Difficult doing work/chores Not difficult at all Somewhat difficult Very difficult    Past Medical History:  Diagnosis Date   Anxiety    Arthritis    osteoarthritis - back and neck    Cough    helped by albuteral   Depression    Migraine headache    TMJ syndrome    Wears contact lenses    Past Surgical History:  Procedure Laterality Date   ABDOMINAL HYSTERECTOMY     MAXILLARY ANTROSTOMY Bilateral 01/06/2020   Procedure: MAXILLARY ANTROSTOMY with tissue;  Surgeon: , MD;  Location: [facility];  Service: ENT;  Laterality: Bilateral;   NASAL SINUS SURGERY Bilateral 01/06/2020   Procedure: ENDOSCOPIC SINUS SURGERY;  Surgeon: , MD;  Location: [facility];  Service: ENT;  Laterality: Bilateral;   TONSILLECTOMY       Social History   Socioeconomic History   Marital status: Divorced    Spouse name: Not on file   Number of children: Not on file   Years of education: Not on file   Highest education level: Not on file  Occupational History   Not on file  Tobacco Use   Smoking status: Every Day    Packs/day: 1.00    Years: 20.00    Total pack years: 20.00    Types: Cigarettes   Smokeless tobacco: Never   Tobacco comments:    since age 25  Vaping Use   Vaping Use: Never used  Substance and Sexual Activity   Alcohol use: Yes  Alcohol/week: 3.0 standard drinks of alcohol    Types: 3 Cans of beer per week   Drug use: Never   Sexual activity: Not on file  Other Topics Concern   Not on file  Social History Narrative   Not on file   Social Determinants of Health   Financial Resource Strain: Not on file  Food Insecurity: Not on file  Transportation Needs: Not on file  Physical Activity: Not on file  Stress: Not on file  Social Connections: Not on file  Intimate Partner Violence: Not on file   Family History  Problem Relation Age of Onset   Heart disease Mother    Heart disease Father    Breast cancer Maternal Grandmother    Current Outpatient Medications on File Prior to Visit  Medication Sig   albuterol (VENTOLIN HFA) 108 (90 Base) MCG/ACT inhaler Inhale 1-2 puffs into the lungs every 4 (four) hours as  needed for wheezing or shortness of breath.   amphetamine-dextroamphetamine (ADDERALL) 20 MG tablet Take 1 tablet (20 mg total) by mouth 2 (two) times daily.   aspirin-acetaminophen-caffeine (EXCEDRIN MIGRAINE) 250-250-65 MG tablet Take by mouth every 6 (six) hours as needed for headache.   dicyclomine (BENTYL) 10 MG capsule Take 1 capsule (10 mg total) by mouth 4 (four) times daily -  before meals and at bedtime. As needed   fluticasone (FLONASE) 50 MCG/ACT nasal spray Place 2 sprays into both nostrils daily. Use for 4-6 weeks then stop and use seasonally or as needed.   gabapentin (NEURONTIN) 300 MG capsule Take 300 mg by mouth in the morning, at noon, in the evening, and at bedtime.   omeprazole (PRILOSEC) 40 MG capsule Take 1 capsule (40 mg total) by mouth daily before breakfast. For up to 2-3 months, may taper down on it if able in future.   ondansetron (ZOFRAN ODT) 4 MG disintegrating tablet Take 1 tablet (4 mg total) by mouth every 8 (eight) hours as needed for nausea or vomiting.   sucralfate (CARAFATE) 1 g tablet Take 1 tablet (1 g total) by mouth 4 (four) times daily -  with meals and at bedtime. As needed for upset stomach, abdominal pain, nausea   tiZANidine (ZANAFLEX) 2 MG tablet Take 2 mg by mouth 2 (two) times daily. One tablet in twice daily and two at bedtime.   traMADol (ULTRAM) 50 MG tablet Take 1 tablet by mouth every 6 (six) hours as needed. Patient takes 2-3 x daily   traZODone (DESYREL) 100 MG tablet Take 1 tablet (100 mg total) by mouth at bedtime.   No current facility-administered medications on file prior to visit.    Review of Systems  Constitutional:  Negative for activity change, appetite change, chills, diaphoresis, fatigue and fever.  HENT:  Negative for congestion and hearing loss.   Eyes:  Negative for visual disturbance.  Respiratory:  Negative for cough, chest tightness, shortness of breath and wheezing.   Cardiovascular:  Negative for chest pain, palpitations  and leg swelling.  Gastrointestinal:  Negative for abdominal pain, constipation, diarrhea, nausea and vomiting.  Genitourinary:  Negative for dysuria, frequency and hematuria.  Musculoskeletal:  Negative for arthralgias and neck pain.  Skin:  Negative for rash.       Right breast lump  Neurological:  Negative for dizziness, weakness, light-headedness, numbness and headaches.  Hematological:  Negative for adenopathy.  Psychiatric/Behavioral:  Negative for behavioral problems, dysphoric mood and sleep disturbance.    Per HPI unless specifically indicated above  Objective:    BP 116/79   Pulse 66   Ht 5\' 5"  (1.651 m)   Wt 155 lb 9.6 oz (70.6 kg)   SpO2 100%   BMI 25.89 kg/m   Wt Readings from Last 3 Encounters:  01/31/22 155 lb 9.6 oz (70.6 kg)  07/27/21 150 lb (68 kg)  02/11/21 151 lb (68.5 kg)    Physical Exam Vitals and nursing note reviewed.  Constitutional:      General: She is not in acute distress.    Appearance: She is well-developed. She is not diaphoretic.     Comments: Well-appearing, comfortable, cooperative  HENT:     Head: Normocephalic and atraumatic.  Eyes:     General:        Right eye: No discharge.        Left eye: No discharge.     Conjunctiva/sclera: Conjunctivae normal.     Pupils: Pupils are equal, round, and reactive to light.  Neck:     Thyroid: No thyromegaly.     Vascular: No carotid bruit.  Cardiovascular:     Rate and Rhythm: Normal rate and regular rhythm.     Pulses: Normal pulses.     Heart sounds: Normal heart sounds. No murmur heard. Pulmonary:     Effort: Pulmonary effort is normal. No respiratory distress.     Breath sounds: Normal breath sounds. No wheezing or rales.  Chest:     Comments: Right Breast under nipple 5 o clock with 1 x 1 cm nodular density non tender. No skin changes. No other lump or node swelling. Breast exam chaperoned by 04/13/21 CMA Abdominal:     General: Bowel sounds are normal. There is no  distension.     Palpations: Abdomen is soft. There is no mass.     Tenderness: There is no abdominal tenderness.  Musculoskeletal:        General: No tenderness. Normal range of motion.     Cervical back: Normal range of motion and neck supple.     Right lower leg: No edema.     Left lower leg: No edema.     Comments: Upper / Lower Extremities: - Normal muscle tone, strength bilateral upper extremities 5/5, lower extremities 5/5  Lymphadenopathy:     Cervical: No cervical adenopathy.  Skin:    General: Skin is warm and dry.     Findings: No erythema or rash.  Neurological:     Mental Status: She is alert and oriented to person, place, and time.     Comments: Distal sensation intact to light touch all extremities  Psychiatric:        Mood and Affect: Mood normal.        Behavior: Behavior normal.        Thought Content: Thought content normal.     Comments: Well groomed, good eye contact, normal speech and thoughts    Results for orders placed or performed in visit on 08/23/20  Novel Coronavirus, NAA ([facility])   Specimen: Nasopharyngeal(NP) swabs in vial transport medium   Nasopharynge  Screenin  Result Value Ref Range   SARS-CoV-2, NAA Not Detected Not Detected  SARS-COV-2, NAA 2 DAY TAT   Nasopharynge  Screenin  Result Value Ref Range   SARS-CoV-2, NAA 2 DAY TAT Performed       Assessment & Plan:   Problem List Items Addressed This Visit     Insomnia due to medical condition   Relevant Orders   TSH  Other Visit Diagnoses     Annual physical exam    -  Primary   Relevant Orders   COMPLETE METABOLIC PANEL WITH GFR   CBC with Differential/Platelet   Lipid panel   Hemoglobin A1c   Hepatitis C antibody   TSH   Screening cholesterol level       Relevant Orders   Lipid panel   Abnormal glucose       Relevant Orders   Hemoglobin A1c   Need for hepatitis C screening test       Relevant Orders   Hepatitis C antibody   Unable to lose weight       Relevant  Orders   TSH   Breast lump on right side at 5 o'clock position       Relevant Orders   MM DIAG BREAST TOMO BILATERAL   US BREAST LTD UNI RIGHT INC AXILLA       Updated Health Maintenance information Fasting lab only, pending results. Encouraged improvement to lifestyle with diet and exercise Goal of weight loss  ADD / Inattention Will review her medication, update PDMP Renew as indicated when request can do 3 x separate rx as prescribed for Adderall 20mg  BID dosing with fill dates. Occasional she switches pharmacy to [facility] if out of stock at [facility].  #Right breast lump / mass Under areola, 1 x 1 cm nodular density palpable, new change over past few months. No prior mammograms Will pursue diagnostic work up with [facility] [place] [facility]ided, ordered and scheduled for [facility], patient notified of apt.  Orders Placed This Encounter  Procedures   MM DIAG BREAST TOMO BILATERAL    Standing Status:   Future    Standing Expiration Date:   08/03/2022    Order Specific Question:   Reason for Exam (SYMPTOM  OR DIAGNOSIS REQUIRED)    Answer:   Right breast 5 o clock new lump or mass for past few months. Non tender. Fam history breast cancer.    Order Specific Question:   Preferred imaging location?    Answer:   [facility]   08/05/2022 BREAST LTD UNI RIGHT INC AXILLA    Standing Status:   Future    Standing Expiration Date:   08/03/2022    Order Specific Question:   Reason for Exam (SYMPTOM  OR DIAGNOSIS REQUIRED)    Answer:   right breast lump or mass 5 o clock, new over few months, MGM breast cancer history    Order Specific Question:   Preferred imaging location?    Answer:   [facility]   COMPLETE METABOLIC PANEL WITH GFR   CBC with Differential/Platelet   Lipid panel    Order Specific Question:   Has the patient fasted?    Answer:   Yes   Hemoglobin A1c   Hepatitis C antibody   TSH      No orders of the defined types were placed in this encounter.     Follow up  plan: Return in about 6 months (around 08/03/2022) for 6 month follow-up ADD refill updates.  08/05/2022, DO [facility] 01/31/2022, 8:42 AM

## 2022-01-31 NOTE — Patient Instructions (Addendum)
Thank you for coming to the office today.  Stay tuned for diagnostic mammogram / [place]  [facility] [address] [place], [place] [address] Phone: [phone]  ------------------------------------------------------  Will plan to refer to Neurology.  [facility] - Neurology Dept [address] [place], [place] [address] Phone: [phone]  -------------------------------  Add Refills Adderall 20 twice a day, x 3, 8/15, 9/12, 10/10  Labs today, pending results.   Please schedule a Follow-up Appointment to: Return in about 6 months (around 08/03/2022) for 6 month follow-up ADD refill updates.  If you have any other questions or concerns, please feel free to call the office or send a message through MyChart. You may also schedule an earlier appointment if necessary.  Additionally, you may be receiving a survey about your experience at our office within a few days to 1 week by e-mail or mail. We value your feedback.  [name], DO [facility], [place]

## 2022-02-01 LAB — CBC WITH DIFFERENTIAL/PLATELET
Absolute Monocytes: 249 cells/uL (ref 200–950)
Basophils Absolute: 11 cells/uL (ref 0–200)
Basophils Relative: 0.2 %
Eosinophils Absolute: 0 cells/uL — ABNORMAL LOW (ref 15–500)
Eosinophils Relative: 0 %
HCT: 43.4 % (ref 35.0–45.0)
Hemoglobin: 14.2 g/dL (ref 11.7–15.5)
Lymphs Abs: 1797 cells/uL (ref 850–3900)
MCH: 27.7 pg (ref 27.0–33.0)
MCHC: 32.7 g/dL (ref 32.0–36.0)
MCV: 84.8 fL (ref 80.0–100.0)
MPV: 9.5 fL (ref 7.5–12.5)
Monocytes Relative: 4.7 %
Neutro Abs: 3244 cells/uL (ref 1500–7800)
Neutrophils Relative %: 61.2 %
Platelets: 241 10*3/uL (ref 140–400)
RBC: 5.12 10*6/uL — ABNORMAL HIGH (ref 3.80–5.10)
RDW: 12.8 % (ref 11.0–15.0)
Total Lymphocyte: 33.9 %
WBC: 5.3 10*3/uL (ref 3.8–10.8)

## 2022-02-01 LAB — COMPLETE METABOLIC PANEL WITH GFR
AG Ratio: 1.7 (calc) (ref 1.0–2.5)
ALT: 34 U/L — ABNORMAL HIGH (ref 6–29)
AST: 22 U/L (ref 10–30)
Albumin: 4.4 g/dL (ref 3.6–5.1)
Alkaline phosphatase (APISO): 57 U/L (ref 31–125)
BUN: 15 mg/dL (ref 7–25)
CO2: 28 mmol/L (ref 20–32)
Calcium: 9.4 mg/dL (ref 8.6–10.2)
Chloride: 104 mmol/L (ref 98–110)
Creat: 0.86 mg/dL (ref 0.50–0.97)
Globulin: 2.6 g/dL (calc) (ref 1.9–3.7)
Glucose, Bld: 86 mg/dL (ref 65–99)
Potassium: 4.2 mmol/L (ref 3.5–5.3)
Sodium: 140 mmol/L (ref 135–146)
Total Bilirubin: 0.2 mg/dL (ref 0.2–1.2)
Total Protein: 7 g/dL (ref 6.1–8.1)
eGFR: 89 mL/min/{1.73_m2} (ref >=60)

## 2022-02-01 LAB — LIPID PANEL
Cholesterol: 173 mg/dL (ref <200)
HDL: 55 mg/dL (ref >=50)
LDL Cholesterol (Calc): 98 mg/dL (calc)
Non-HDL Cholesterol (Calc): 118 mg/dL (calc) (ref <130)
Total CHOL/HDL Ratio: 3.1 (calc) (ref <5.0)
Triglycerides: 108 mg/dL (ref <150)

## 2022-02-01 LAB — HEMOGLOBIN A1C
Hgb A1c MFr Bld: 5.1 % of total Hgb (ref <5.7)
Mean Plasma Glucose: 100 mg/dL
eAG (mmol/L): 5.5 mmol/L

## 2022-02-01 LAB — HEPATITIS C ANTIBODY: Hepatitis C Ab: NONREACTIVE

## 2022-02-01 LAB — TSH: TSH: 2.96 mIU/L

## 2022-02-16 ENCOUNTER — Ambulatory Visit
Admission: RE | Admit: 2022-02-16 | Discharge: 2022-02-16 | Disposition: A | Discharge status: Home / Self Care | Payer: Medicaid Other | Source: Ambulatory Visit | Attending: Family Medicine | Admitting: Family Medicine

## 2022-02-16 DIAGNOSIS — N6314 Unspecified lump in the right breast, lower inner quadrant: Secondary | ICD-10-CM

## 2022-02-16 DIAGNOSIS — R921 Mammographic calcification found on diagnostic imaging of breast: Secondary | ICD-10-CM | POA: Diagnosis not present

## 2022-02-17 ENCOUNTER — Other Ambulatory Visit: Payer: Self-pay | Admitting: Family Medicine

## 2022-02-17 DIAGNOSIS — R921 Mammographic calcification found on diagnostic imaging of breast: Secondary | ICD-10-CM

## 2022-02-17 DIAGNOSIS — N63 Unspecified lump in unspecified breast: Secondary | ICD-10-CM

## 2022-02-17 DIAGNOSIS — R928 Other abnormal and inconclusive findings on diagnostic imaging of breast: Secondary | ICD-10-CM

## 2022-02-22 ENCOUNTER — Other Ambulatory Visit: Payer: Self-pay | Admitting: Family Medicine

## 2022-02-22 DIAGNOSIS — R928 Other abnormal and inconclusive findings on diagnostic imaging of breast: Secondary | ICD-10-CM

## 2022-02-22 DIAGNOSIS — N6001 Solitary cyst of right breast: Secondary | ICD-10-CM

## 2022-03-01 ENCOUNTER — Ambulatory Visit
Admission: RE | Admit: 2022-03-01 | Discharge: 2022-03-01 | Disposition: A | Discharge status: Home / Self Care | Payer: Medicaid Other | Source: Ambulatory Visit | Attending: Family Medicine | Admitting: Family Medicine

## 2022-03-01 DIAGNOSIS — R928 Other abnormal and inconclusive findings on diagnostic imaging of breast: Secondary | ICD-10-CM

## 2022-03-01 DIAGNOSIS — N63 Unspecified lump in unspecified breast: Secondary | ICD-10-CM | POA: Insufficient documentation

## 2022-03-01 DIAGNOSIS — N6001 Solitary cyst of right breast: Secondary | ICD-10-CM | POA: Diagnosis not present

## 2022-03-01 DIAGNOSIS — N6312 Unspecified lump in the right breast, upper inner quadrant: Secondary | ICD-10-CM | POA: Diagnosis not present

## 2022-03-01 HISTORY — PX: BREAST CYST ASPIRATION: SHX578

## 2022-03-01 HISTORY — PX: BREAST BIOPSY: SHX20

## 2022-03-02 LAB — SURGICAL PATHOLOGY

## 2022-03-06 ENCOUNTER — Ambulatory Visit
Admission: RE | Admit: 2022-03-06 | Discharge: 2022-03-06 | Disposition: A | Discharge status: Home / Self Care | Payer: Medicaid Other | Source: Ambulatory Visit | Attending: Family Medicine | Admitting: Family Medicine

## 2022-03-06 DIAGNOSIS — R928 Other abnormal and inconclusive findings on diagnostic imaging of breast: Secondary | ICD-10-CM | POA: Insufficient documentation

## 2022-03-06 DIAGNOSIS — N6082 Other benign mammary dysplasias of left breast: Secondary | ICD-10-CM | POA: Diagnosis not present

## 2022-03-06 DIAGNOSIS — R921 Mammographic calcification found on diagnostic imaging of breast: Secondary | ICD-10-CM | POA: Insufficient documentation

## 2022-03-06 HISTORY — PX: BREAST BIOPSY: SHX20

## 2022-03-07 LAB — SURGICAL PATHOLOGY

## 2022-03-28 ENCOUNTER — Encounter: Payer: Self-pay | Admitting: Family Medicine

## 2022-03-28 ENCOUNTER — Other Ambulatory Visit: Payer: Self-pay | Admitting: Family Medicine

## 2022-03-28 MED ORDER — TIZANIDINE HCL 2 MG PO TABS: 2.0000 mg | ORAL_TABLET | Freq: Two times a day (BID) | ORAL | 0 refills | Status: DC

## 2022-03-28 MED ORDER — GABAPENTIN 300 MG PO CAPS: 300.0000 mg | ORAL_CAPSULE | Freq: Four times a day (QID) | ORAL | 0 refills | Status: DC

## 2022-03-29 ENCOUNTER — Other Ambulatory Visit: Payer: Self-pay

## 2022-03-29 ENCOUNTER — Encounter: Payer: Self-pay | Admitting: Family Medicine

## 2022-03-29 DIAGNOSIS — F4310 Post-traumatic stress disorder, unspecified: Secondary | ICD-10-CM

## 2022-03-29 DIAGNOSIS — G4701 Insomnia due to medical condition: Secondary | ICD-10-CM

## 2022-03-29 MED ORDER — TRAZODONE HCL 100 MG PO TABS: 100.0000 mg | ORAL_TABLET | Freq: Every day | ORAL | 1 refills | Status: DC

## 2022-04-25 ENCOUNTER — Encounter: Payer: Self-pay | Admitting: Family Medicine

## 2022-04-25 DIAGNOSIS — G5621 Lesion of ulnar nerve, right upper limb: Secondary | ICD-10-CM

## 2022-04-26 MED ORDER — GABAPENTIN 300 MG PO CAPS: ORAL_CAPSULE | ORAL | 3 refills | Status: DC

## 2022-04-26 MED ORDER — TIZANIDINE HCL 4 MG PO TABS: 4.0000 mg | ORAL_TABLET | Freq: Three times a day (TID) | ORAL | 3 refills | Status: DC

## 2022-06-23 ENCOUNTER — Other Ambulatory Visit: Payer: Self-pay | Admitting: Family Medicine

## 2022-06-23 DIAGNOSIS — R41840 Attention and concentration deficit: Secondary | ICD-10-CM

## 2022-06-23 MED ORDER — AMPHETAMINE-DEXTROAMPHETAMINE 20 MG PO TABS: 20.0000 mg | ORAL_TABLET | Freq: Two times a day (BID) | ORAL | 0 refills | Status: DC

## 2022-06-27 ENCOUNTER — Other Ambulatory Visit: Payer: Self-pay | Admitting: Family Medicine

## 2022-06-27 DIAGNOSIS — R41840 Attention and concentration deficit: Secondary | ICD-10-CM

## 2022-06-27 MED ORDER — AMPHETAMINE-DEXTROAMPHETAMINE 20 MG PO TABS: 20.0000 mg | ORAL_TABLET | Freq: Two times a day (BID) | ORAL | 0 refills | Status: DC

## 2022-08-24 ENCOUNTER — Other Ambulatory Visit: Payer: Self-pay | Admitting: Family Medicine

## 2022-08-24 DIAGNOSIS — R41840 Attention and concentration deficit: Secondary | ICD-10-CM

## 2022-08-24 MED ORDER — AMPHETAMINE-DEXTROAMPHETAMINE 20 MG PO TABS: 20.0000 mg | ORAL_TABLET | Freq: Two times a day (BID) | ORAL | 0 refills | Status: DC

## 2022-09-15 ENCOUNTER — Other Ambulatory Visit: Payer: Self-pay | Admitting: Family Medicine

## 2022-09-15 DIAGNOSIS — F4310 Post-traumatic stress disorder, unspecified: Secondary | ICD-10-CM

## 2022-09-15 DIAGNOSIS — G4701 Insomnia due to medical condition: Secondary | ICD-10-CM

## 2022-09-18 NOTE — Telephone Encounter (Signed)
Last RF 03/29/22 #90 1 RF  Active med list : yes RF due ? Yes Future [id number] pt to make appt, unable to LM, VM full 90 day courtesy refill given    Requested Prescriptions  Pending Prescriptions Disp Refills   traZODone (DESYREL) 100 MG tablet [Pharmacy Med Name: traZODone HCl 100 MG Oral Tablet] 90 tablet 0    Sig: TAKE 1 TABLET BY MOUTH AT BEDTIME     Psychiatry: Antidepressants - Serotonin Modulator Failed - 09/15/2022  6:17 PM      Failed - Valid encounter within last 6 months    Recent Outpatient Visits           7 months ago Annual physical exam   [facility] Olin Hauser, DO   1 year ago Acute non-recurrent frontal sinusitis   [place], [place]   1 year ago Pelvic pain in female   [place], DO   2 years ago Chronic sinusitis of both maxillary sinuses   [place], DO   3 years ago Inattention   [place], [place]

## 2022-11-17 ENCOUNTER — Encounter: Payer: Self-pay | Admitting: Family Medicine

## 2022-11-17 ENCOUNTER — Ambulatory Visit (INDEPENDENT_AMBULATORY_CARE_PROVIDER_SITE_OTHER): Payer: Medicaid Other | Admitting: Family Medicine

## 2022-11-17 VITALS — BP 122/66 | HR 110 | Temp 96.8°F | Wt 156.0 lb

## 2022-11-17 DIAGNOSIS — M25551 Pain in right hip: Secondary | ICD-10-CM | POA: Diagnosis not present

## 2022-11-17 DIAGNOSIS — M7121 Synovial cyst of popliteal space [Baker], right knee: Secondary | ICD-10-CM | POA: Diagnosis not present

## 2022-11-17 DIAGNOSIS — M25561 Pain in right knee: Secondary | ICD-10-CM

## 2022-11-17 DIAGNOSIS — J9801 Acute bronchospasm: Secondary | ICD-10-CM

## 2022-11-17 DIAGNOSIS — R41840 Attention and concentration deficit: Secondary | ICD-10-CM

## 2022-11-17 DIAGNOSIS — G8929 Other chronic pain: Secondary | ICD-10-CM

## 2022-11-17 MED ORDER — AMPHETAMINE-DEXTROAMPHETAMINE 20 MG PO TABS: 20.0000 mg | ORAL_TABLET | Freq: Two times a day (BID) | ORAL | 0 refills | Status: DC

## 2022-11-17 MED ORDER — FUROSEMIDE 20 MG PO TABS: 20.0000 mg | ORAL_TABLET | Freq: Every day | ORAL | 0 refills | Status: DC | PRN

## 2022-11-17 MED ORDER — ALBUTEROL SULFATE HFA 108 (90 BASE) MCG/ACT IN AERS: 1.0000 | INHALATION_SPRAY | RESPIRATORY_TRACT | 2 refills | Status: DC | PRN

## 2022-11-17 MED ORDER — TRAMADOL HCL 50 MG PO TABS: 50.0000 mg | ORAL_TABLET | Freq: Four times a day (QID) | ORAL | 0 refills | Status: DC | PRN

## 2022-11-17 MED ORDER — FLUTICASONE PROPIONATE 50 MCG/ACT NA SUSP: 2.0000 | Freq: Every day | NASAL | 3 refills | Status: AC

## 2022-11-17 NOTE — Progress Notes (Signed)
Subjective:    Patient ID: Veronica Lyons, female    DOB: [birth year], 42 y.o.   MRN: [id number]  Veronica Lyons is a 42 y.o. female presenting on 11/17/2022 for Hip Pain  She is moving back to [place] next week.  HPI  TMJ Admits Jaw Pain flare Left >R difficulty opening jaw to eat, it has improved. Tried mouthguard at night  R Low Back / Hip and Knee Pain History of Sciatica pain Reports R sided Hip Pain, no injury Also admits Right Knee Pain (lateral and posterior) with swelling. Sleeping with leg propped up Taking Ibuprofen 800mg  few times a day AS NEEDED / Aleve AS NEEDED Taking Tizanidine 4mg  THREE TIMES A DAY PRN, Gabapentin 300mg  / 300mg  afternoon / 600mg  PM, Trazodone regularly.  She was followed by [facility] She has weaned off Tramadol roughly 6+ months. She always has some chronic pain in her low back. But had been manageable until flare up. Interested to restart Tramadol short term now  ADHD Adderall 20mg  IR TWICE A DAY dosing has been effective for her.  Admtis increased activity packing lately. May have flared up symptoms She is moving back to [place] next week.      11/17/2022    9:20 AM 01/31/2022    8:54 AM 07/27/2021    9:42 AM  Depression screen PHQ 2/9  Decreased Interest 1 1 1   Down, Depressed, Hopeless 1 1 1   PHQ - 2 Score 2 2 2   Altered sleeping 1 0 0  Tired, decreased energy 1 1 1   Change in appetite 1 2 1   Feeling bad or failure about yourself  1 1 2   Trouble concentrating 0 0 1  Moving slowly or fidgety/restless 0 0 0  Suicidal thoughts 0 0 0  PHQ-9 Score 6 6 7   Difficult doing work/chores Somewhat difficult Not difficult at all Somewhat difficult    Social History   Tobacco Use   Smoking status: Every Day    Packs/day: 1.00    Years: 20.00    Additional pack years: 0.00    Total pack years: 20.00    Types: Cigarettes   Smokeless tobacco: Never   Tobacco comments:    since age 19  Vaping Use   Vaping Use: Never  used  Substance Use Topics   Alcohol use: Yes    Alcohol/week: 3.0 standard drinks of alcohol    Types: 3 Cans of beer per week   Drug use: Never    Review of Systems Per HPI unless specifically indicated above     Objective:    BP 122/66 (BP Location: Left Arm, Patient Position: Sitting, Cuff Size: Normal)   Pulse (!) 110   Temp (!) 96.8 F (36 C) (Temporal)   Wt 156 lb (70.8 kg)   SpO2 97%   BMI 25.96 kg/m   Wt Readings from Last 3 Encounters:  11/17/22 156 lb (70.8 kg)  01/31/22 155 lb 9.6 oz (70.6 kg)  07/27/21 150 lb (68 kg)    Physical Exam Vitals and nursing note reviewed.  Constitutional:      General: She is not in acute distress.    Appearance: Normal appearance. She is well-developed. She is not diaphoretic.     Comments: Well-appearing, comfortable, cooperative  HENT:     Head: Normocephalic and atraumatic.  Eyes:     General:        Right eye: No discharge.        Left eye: No discharge.  Conjunctiva/sclera: Conjunctivae normal.  Cardiovascular:     Rate and Rhythm: Normal rate.  Pulmonary:     Effort: Pulmonary effort is normal.  Musculoskeletal:     Comments: Low Back / R hip Inspection: BACK - Normal appearance, no spinal deformity, symmetrical. HIP - Normal appearance, symmetrical, no obvious leg length or pelvis deformity  Palpation: BACK - No tenderness over spinous processes. Bilateral lumbar paraspinal muscles non-tender and without hypertonicity/spasm. Some mild discomfort over R iliac crest region of hip. HIP - Mild tender to palpation deeper hip but not over greater trochanter.  ROM: BACK - Full active ROM forward flex / back extension, rotation L/R without discomfort HIP - pain with hip extension and weight bearing and internal rotation.  Special Testing: BACK - Seated SLR negative for radicular pain bilaterally HIP - FABER with pain provoked and acetabular compression with pain provoked  Strength: Bilateral hip flex/ext 5/5,  knee flex/ext 5/5, ankle dorsiflex/plantarflex 5/5 Neurovascular: intact distal sensation to light touch   R Knee with some posterior swelling present mild baker's cyst. Has some crepitus, but full range of motion  Skin:    General: Skin is warm and dry.     Findings: No erythema or rash.  Neurological:     Mental Status: She is alert and oriented to person, place, and time.  Psychiatric:        Mood and Affect: Mood normal.        Behavior: Behavior normal.        Thought Content: Thought content normal.     Comments: Well groomed, good eye contact, normal speech and thoughts    Results for orders placed or performed during the hospital encounter of 03/06/22  Surgical pathology  Result Value Ref Range   SURGICAL PATHOLOGY      SURGICAL PATHOLOGY CASE: [id number] PATIENT: [facility] Surgical Pathology Report     Specimen Submitted: A. Breast, left inferior anterior B. Breast, left superior anterior  Clinical History: CALCS.  FCC vs DCIS both sites. A - Ribbon-shaped clip placed following stereotactic biopsy of LEFT breast, inferior retroareolar. B - Coil-shaped clip placed following stereotactic biopsy of LEFT breast, superior retroareolar.    DIAGNOSIS: A. 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 - NEGATIVE FOR ATYPICAL PROLIFERATIVE BREAST DISEASE.  B. BREAST, LEFT SUPERIOR/ANTERIOR; STEREOTACTIC CORE NEEDLE BIOPSY: - BENIGN MAMMARY PARENCHYMA WITH DENSE STROMAL FIBROSIS AND FOCAL PSUEDOANGIOMATOUS STROMAL HYPERPLASIA, FIBROCYSTIC AND APOCRINE CHANGES, COLUMNAR CELL CHANGE WITHOUT ATYPIA,  AND USUAL DUCTAL HYPERPLASIA, WITH ASSOCIATED MICROCALCIFICATIONS. - NEGATIVE FOR ATYPICAL PROLIFERATIVE BREAST DISEASE.  GROSS DESCRIPTION: A. Labeled: Left breast stereo calcs  inferior/anterior #1 Received: in a formalin-filled Brevera collection device Specimen radiograph image(s) available for review Time/Date in fixative: Collected at 8:25 AM on 03/06/2022 and placed in formalin at 8:28 AM on 03/06/2022 Cold ischemic time: Less than 5 minutes Total fixation time: Approximately 8.75 hours Core pieces: Multiple Measurement: Aggregate, 7.5 x 1 x 0.3 cm Description / comments: Received are cores and fragments of yellow and white fibrofatty tissue.  The accompanying diagram with section D checked.  No distinct tissue fragments are grossly appreciated in sections C, J, H, and L. Inked: Blue Entirely submitted in cassette(s):  1 - section D 2 - 4 - remaining tissue fragments      2 - sections A and B      3 - sections E and  F      4 - sections G, H, and I  B. La beled: Left breast stereo calcs superior/anterior #2 Received: in a formalin-filled Brevera collection device Specimen radiograph image(s) available for review Time/Date in fixative: Collected at 8:42 AM on 03/06/2022 and placed in formalin at 8:43 AM on 03/06/2022 Cold ischemic time: Less than 5 minutes Total fixation time: Approximately 8.5 hours Core pieces: Multiple Measurement: Aggregate, 9.2 x 1 x 0.3 cm Description / comments: Received are cores and fragments of yellow fibrofatty tissue.  The accompanying diagram has sections B, E, and F checked. Inked: Black Entirely submitted in cassette(s):  1 - section B 2 - section E 3 - section F 4 - 6 - remaining tissue fragments      4 - sections A and C      5 - sections D and G      6 - section I and remaining free-floating fragments  RB 03/06/2022  Final Diagnosis performed by Katherine Mantle, MD.   Electronically signed 03/07/2022 8:33:29AM The electronic signature indicates that the named Attending Pathologist ha s evaluated the specimen Technical component performed at [facility], [address], [place], [place] [address] Lab: [phone] Dir:  Jolene Schimke, MD, MMM  Professional component performed at [facility], [facility], [address] [place], [place], [place] [address] Lab: [phone] Dir: Beryle Quant, MD       Assessment & Plan:   Problem List Items Addressed This Visit   None Visit Diagnoses     Chronic pain of right knee    -  Primary   Relevant Medications   traMADol (ULTRAM) 50 MG tablet   Other Relevant Orders   DG Knee Complete 4 Views Right   Inattention       Relevant Medications   amphetamine-dextroamphetamine (ADDERALL) 20 MG tablet   Bronchospasm, acute       Relevant Medications   fluticasone (FLONASE) 50 MCG/ACT nasal spray   albuterol (VENTOLIN HFA) 108 (90 Base) MCG/ACT inhaler   Chronic right hip pain       Relevant Medications   traMADol (ULTRAM) 50 MG tablet   Other Relevant Orders   DG HIP UNILAT W OR W/O PELVIS 2-3 VIEWS RIGHT   Baker's cyst of knee, right       Relevant Medications   furosemide (LASIX) 20 MG tablet       TMJ Continue treatment with medications / anti inflammatories Recommend Physical Therapy specialized in TMJ, can consider dry needling etc.  Medications all refilled  Agree to re order Tramadol - 50mg  q 6 hr AS NEEDED, she has done well on it previously her pain management was able to get her off of med, but now has significant flare up, she is moving and will need medication.  ADHD Continue Adderall, new rx sent. 20mg  BID  Baker's Cyst R Knee swelling Added Furosemide 20mg  for diuretic - for swelling / knee - take 1-2 daily in morning for up to 3 days max, then pause can take intermittently. RICE therapy  R Hip Pain Less likely bursitis, seems to be actual acetabular hip joint pain on exam and history Can be arthritis or other inflammatory in nature Likely related to the R Knee swelling  Check R Hip and R Knee X-rays here on Monday  She is relocating moving out of state [place]. I advised I am able to help with rx up to 90 days with  her move, but I cannot handle controlled substance orders  out of state.   Meds ordered this encounter  Medications   amphetamine-dextroamphetamine (ADDERALL) 20 MG tablet    Sig: Take 1 tablet (20 mg total) by mouth 2 (two) times daily.    Dispense:  60 tablet    Refill:  0    First fill 11/17/22   fluticasone (FLONASE) 50 MCG/ACT nasal spray    Sig: Place 2 sprays into both nostrils daily. Use for 4-6 weeks then stop and use seasonally or as needed.    Dispense:  16 g    Refill:  3   albuterol (VENTOLIN HFA) 108 (90 Base) MCG/ACT inhaler    Sig: Inhale 1-2 puffs into the lungs every 4 (four) hours as needed for wheezing or shortness of breath.    Dispense:  8 g    Refill:  2   traMADol (ULTRAM) 50 MG tablet    Sig: Take 1 tablet (50 mg total) by mouth every 6 (six) hours as needed for moderate pain. Patient takes 2-3 x daily    Dispense:  90 tablet    Refill:  0   furosemide (LASIX) 20 MG tablet    Sig: Take 1-2 tablets (20-40 mg total) by mouth daily as needed for fluid.    Dispense:  30 tablet    Refill:  0      Follow up plan: Return if symptoms worsen or fail to improve.   [name], DO [facility] 11/17/2022, 8:58 AM

## 2022-11-17 NOTE — Patient Instructions (Addendum)
Thank you for coming to the office today.  TMJ Continue treatment with medications / anti inflammatories Recommend Physical Therapy specialized in TMJ, can consider dry needling etc.  Medications all refilled  Tramadol, Adderall  Added Furosemide 20mg  for diuretic - for swelling / knee - take 1-2 daily in morning for up to 3 days max, then pause can take intermittently.  For the Hip Does not seem to be lower bursitis. Most likely ball and socket hip joint flare. Can be arthritis or other inflammatory in nature Likely related to the R Knee swelling  X-rays here on Monday  Use RICE therapy: - R - Rest / relative rest with activity modification avoid overuse of joint - I - Ice packs (make sure you use a towel or sock / something to protect skin) - C - Compression with flexible Knee Sleeve / ACE wrap to apply pressure and reduce swelling allowing more support - E - Elevation - if significant swelling, lift leg above heart level (toes above your nose) to help reduce swelling, most helpful at night after day of being on your feet   Please schedule a Follow-up Appointment to: Return if symptoms worsen or fail to improve.  If you have any other questions or concerns, please feel free to call the office or send a message through MyChart. You may also schedule an earlier appointment if necessary.  Additionally, you may be receiving a survey about your experience at our office within a few days to 1 week by e-mail or mail. We value your feedback.  [name], DO [facility], [place]

## 2022-11-20 ENCOUNTER — Telehealth: Payer: Self-pay

## 2022-11-20 ENCOUNTER — Other Ambulatory Visit: Payer: Self-pay | Admitting: Internal Medicine

## 2022-11-20 DIAGNOSIS — G5621 Lesion of ulnar nerve, right upper limb: Secondary | ICD-10-CM

## 2022-11-20 NOTE — Telephone Encounter (Signed)
Tramadol prior auth initiated via covermymeds (Key: [id number]) PA Case ID #: [id number] Need Help? Call us at [phone] Status sent iconSent to Plan today Drug traMADol HCl 50MG  tablets ePA cloud logo Form [facility] [place] Electronic [place] Form [phone] NCPDP)   [facility] has not replied to your PA request. Turnaround time for review of a PA request is dependent upon insurance plan and can range from 24 hours to 5 calendar days.  You may close this dialog, return to your dashboard, and perform other tasks. To check for an update later, click on the "[employer] Detailed Status" located in the upper right corner of your dashboard.  If this search option is not available or [facility] has not replied to your request within this timeframe, please contact the number on the back of the patient's insurance card.

## 2022-11-21 NOTE — Telephone Encounter (Signed)
Requested Prescriptions  Pending Prescriptions Disp Refills   gabapentin (NEURONTIN) 300 MG capsule [Pharmacy Med Name: GABAPENTIN 300MG  CAPSULES] 60 capsule 3    Sig: TAKE ONE CAPSULE BY MOUTH IN THE MORNING, AT NOON, IN THE EVENING AND AT BEDTIME     Neurology: Anticonvulsants - gabapentin Passed - 11/20/2022  6:21 PM      Passed - Cr in normal range and within 360 days    Creat  Date Value Ref Range Status  01/31/2022 0.86 0.50 - 0.97 mg/dL Final         Passed - Completed PHQ-2 or PHQ-9 in the last 360 days      Passed - Valid encounter within last 12 months    Recent Outpatient Visits           4 days ago Chronic pain of right knee   [facility] Smitty Cords, DO   9 months ago Annual physical exam   [facility] Smitty Cords, DO   1 year ago Acute non-recurrent frontal sinusitis   [facility] Smitty Cords, DO   1 year ago Pelvic pain in female   [facility] [place], Netta Neat, DO   3 years ago Chronic sinusitis of both maxillary sinuses    [facility], Netta Neat, [place]

## 2022-12-20 ENCOUNTER — Encounter: Payer: Self-pay | Admitting: Family Medicine

## 2022-12-20 ENCOUNTER — Other Ambulatory Visit: Payer: Self-pay | Admitting: Family Medicine

## 2022-12-20 DIAGNOSIS — G4701 Insomnia due to medical condition: Secondary | ICD-10-CM

## 2022-12-20 DIAGNOSIS — J9801 Acute bronchospasm: Secondary | ICD-10-CM

## 2022-12-20 DIAGNOSIS — F4310 Post-traumatic stress disorder, unspecified: Secondary | ICD-10-CM

## 2022-12-20 DIAGNOSIS — G8929 Other chronic pain: Secondary | ICD-10-CM

## 2022-12-20 MED ORDER — ALBUTEROL SULFATE HFA 108 (90 BASE) MCG/ACT IN AERS: 1.0000 | INHALATION_SPRAY | RESPIRATORY_TRACT | 2 refills | Status: AC | PRN

## 2022-12-20 MED ORDER — TRAZODONE HCL 100 MG PO TABS: 100.0000 mg | ORAL_TABLET | Freq: Every day | ORAL | 0 refills | Status: AC

## 2022-12-20 MED ORDER — TRAMADOL HCL 50 MG PO TABS: 50.0000 mg | ORAL_TABLET | Freq: Four times a day (QID) | ORAL | 0 refills | Status: DC | PRN

## 2022-12-20 NOTE — Telephone Encounter (Signed)
Medication Refill - Medication: traMADol (ULTRAM) 50 MG tablet /tiZANidine (ZANAFLEX) 4 MG tablet /albuterol (VENTOLIN HFA) 108 (90 Base) MCG/ACT inhaler   Has the patient contacted their pharmacy? No, says sent request thru mychart, that I don't see (Agent: If no, request that the patient contact the pharmacy for the refill. If patient does not wish to contact the pharmacy document the reason why and proceed with request.) (Agent: If yes, when and what did the pharmacy advise?)pt called directly in  Preferred Pharmacy (with phone number or street name): [facility], [address] [place], [place], [place] [address] [phone] Has the patient been seen for an appointment in the last year OR does the patient have an upcoming appointment? yes  Agent: Please be advised that RX refills may take up to 3 business days. We ask that you follow-up with your pharmacy.

## 2022-12-21 ENCOUNTER — Other Ambulatory Visit: Payer: Self-pay | Admitting: Family Medicine

## 2022-12-21 ENCOUNTER — Encounter: Payer: Self-pay | Admitting: Family Medicine

## 2022-12-21 DIAGNOSIS — G5621 Lesion of ulnar nerve, right upper limb: Secondary | ICD-10-CM

## 2022-12-21 DIAGNOSIS — R41840 Attention and concentration deficit: Secondary | ICD-10-CM

## 2022-12-21 MED ORDER — TIZANIDINE HCL 4 MG PO TABS: 4.0000 mg | ORAL_TABLET | Freq: Three times a day (TID) | ORAL | 0 refills | Status: AC

## 2022-12-21 MED ORDER — AMPHETAMINE-DEXTROAMPHETAMINE 20 MG PO TABS: 20.0000 mg | ORAL_TABLET | Freq: Two times a day (BID) | ORAL | 0 refills | Status: DC

## 2022-12-21 NOTE — Telephone Encounter (Signed)
Medication Refill - Medication: amphetamine-dextroamphetamine (ADDERALL) 20 MG tablet   tiZANidine (ZANAFLEX) 4 MG tablet   Has the patient contacted their pharmacy? Yes.   The wrong medications was sent to the pharmacy.   Preferred Pharmacy (with phone number or street name): [id number] - [place], [place] - [address] [place] [address] Phone: [phone]  Fax: [phone]  Has the patient been seen for an appointment in the last year OR does the patient have an upcoming appointment? Yes.    Agent: Please be advised that RX refills may take up to 3 business days. We ask that you follow-up with your pharmacy.

## 2023-01-17 ENCOUNTER — Encounter: Payer: Self-pay | Admitting: Family Medicine

## 2023-01-17 ENCOUNTER — Other Ambulatory Visit: Payer: Self-pay | Admitting: Family Medicine

## 2023-01-17 DIAGNOSIS — R41840 Attention and concentration deficit: Secondary | ICD-10-CM

## 2023-01-17 DIAGNOSIS — G8929 Other chronic pain: Secondary | ICD-10-CM

## 2023-01-17 DIAGNOSIS — M7121 Synovial cyst of popliteal space [Baker], right knee: Secondary | ICD-10-CM

## 2023-01-17 MED ORDER — TRAMADOL HCL 50 MG PO TABS: 50.0000 mg | ORAL_TABLET | Freq: Four times a day (QID) | ORAL | 0 refills | Status: DC | PRN

## 2023-01-17 MED ORDER — AMPHETAMINE-DEXTROAMPHETAMINE 20 MG PO TABS: 20.0000 mg | ORAL_TABLET | Freq: Two times a day (BID) | ORAL | 0 refills | Status: DC

## 2023-01-17 MED ORDER — FUROSEMIDE 20 MG PO TABS: 20.0000 mg | ORAL_TABLET | Freq: Every day | ORAL | 0 refills | Status: DC | PRN

## 2023-01-18 ENCOUNTER — Other Ambulatory Visit: Payer: Self-pay

## 2023-01-18 DIAGNOSIS — G8929 Other chronic pain: Secondary | ICD-10-CM

## 2023-02-13 ENCOUNTER — Other Ambulatory Visit: Payer: Self-pay | Admitting: Family Medicine

## 2023-02-13 DIAGNOSIS — G8929 Other chronic pain: Secondary | ICD-10-CM

## 2023-02-13 DIAGNOSIS — G5621 Lesion of ulnar nerve, right upper limb: Secondary | ICD-10-CM

## 2023-02-13 DIAGNOSIS — M7121 Synovial cyst of popliteal space [Baker], right knee: Secondary | ICD-10-CM

## 2023-02-13 DIAGNOSIS — R41840 Attention and concentration deficit: Secondary | ICD-10-CM

## 2023-02-14 ENCOUNTER — Encounter: Payer: Self-pay | Admitting: Family Medicine

## 2023-02-14 MED ORDER — TRAMADOL HCL 50 MG PO TABS: 50.0000 mg | ORAL_TABLET | Freq: Four times a day (QID) | ORAL | 0 refills | Status: AC | PRN

## 2023-02-14 MED ORDER — FUROSEMIDE 20 MG PO TABS: 20.0000 mg | ORAL_TABLET | Freq: Every day | ORAL | 0 refills | Status: AC | PRN

## 2023-02-14 MED ORDER — AMPHETAMINE-DEXTROAMPHETAMINE 20 MG PO TABS: 20.0000 mg | ORAL_TABLET | Freq: Two times a day (BID) | ORAL | 0 refills | Status: AC

## 2023-02-22 ENCOUNTER — Telehealth: Payer: Medicaid Other | Admitting: Physician Assistant

## 2023-02-22 ENCOUNTER — Encounter: Payer: Self-pay | Admitting: Family Medicine

## 2023-02-22 DIAGNOSIS — R3989 Other symptoms and signs involving the genitourinary system: Secondary | ICD-10-CM

## 2023-02-22 MED ORDER — PHENAZOPYRIDINE HCL 100 MG PO TABS: 100.0000 mg | ORAL_TABLET | Freq: Three times a day (TID) | ORAL | 0 refills | Status: AC | PRN

## 2023-02-22 MED ORDER — SULFAMETHOXAZOLE-TRIMETHOPRIM 800-160 MG PO TABS: 1.0000 | ORAL_TABLET | Freq: Two times a day (BID) | ORAL | 0 refills | Status: AC

## 2023-02-22 NOTE — Progress Notes (Signed)
Virtual Visit Consent   Veronica Lyons, you are scheduled for a virtual visit with a [facility] provider today. Just as with appointments in the office, your consent must be obtained to participate. Your consent will be active for this visit and any virtual visit you may have with one of our providers in the next 365 days. If you have a MyChart account, a copy of this consent can be sent to you electronically.  As this is a virtual visit, video technology does not allow for your provider to perform a traditional examination. This may limit your provider's ability to fully assess your condition. If your provider identifies any concerns that need to be evaluated in person or the need to arrange testing (such as labs, EKG, etc.), we will make arrangements to do so. Although advances in technology are sophisticated, we cannot ensure that it will always work on either your end or our end. If the connection with a video visit is poor, the visit may have to be switched to a telephone visit. With either a video or telephone visit, we are not always able to ensure that we have a secure connection.  By engaging in this virtual visit, you consent to the provision of healthcare and authorize for your insurance to be billed (if applicable) for the services provided during this visit. Depending on your insurance coverage, you may receive a charge related to this service.  I need to obtain your verbal consent now. Are you willing to proceed with your visit today? [name] has provided verbal consent on 02/22/2023 for a virtual visit (video or telephone). Margaretann Loveless, PA-C  Date: 02/22/2023 9:16 AM  Virtual Visit via Video Note   I, Margaretann Loveless, connected with  Veronica Lyons  ([id number], 08-10-1980) on 02/22/23 at  9:15 AM EDT by a video-enabled telemedicine application and verified that I am speaking with the correct person using two identifiers.  Location: Patient: Virtual Visit Location  Patient: Mobile Provider: Virtual Visit Location Provider: Home Office   I discussed the limitations of evaluation and management by telemedicine and the availability of in person appointments. The patient expressed understanding and agreed to proceed.    History of Present Illness: Veronica Lyons is a 42 y.o. who identifies as a female who was assigned female at birth, and is being seen today for UTI.  HPI: Urinary Tract Infection  This is a new problem. The current episode started yesterday. The problem occurs every urination. The problem has been gradually worsening. The quality of the pain is described as burning and aching. The pain is moderate. There has been no fever. She is Not sexually active. There is No history of pyelonephritis. Associated symptoms include frequency, hematuria (and tissue (this is common for her)) and urgency. Pertinent negatives include no chills, discharge, hesitancy, nausea or vomiting. Associated symptoms comments: Urine odor strong. She has tried increased fluids for the symptoms. The treatment provided no relief.     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    Morphine Other (See Comments)    "Loopy" without relief of pain   Medications:  Current Outpatient Medications:    phenazopyridine (PYRIDIUM) 100 MG  tablet, Take 1 tablet (100 mg total) by mouth 3 (three) times daily as needed for pain., Disp: 10 tablet, Rfl: 0   sulfamethoxazole-trimethoprim (BACTRIM DS) 800-160 MG tablet, Take 1 tablet by mouth 2 (two) times daily., Disp: 10 tablet, Rfl: 0   albuterol (VENTOLIN HFA) 108 (90 Base) MCG/ACT inhaler, Inhale 1-2 puffs into the  lungs every 4 (four) hours as needed for wheezing or shortness of breath., Disp: 8 g, Rfl: 2   amphetamine-dextroamphetamine (ADDERALL) 20 MG tablet, Take 1 tablet (20 mg total) by mouth 2 (two) times daily., Disp: 60 tablet, Rfl: 0   aspirin-acetaminophen-caffeine (EXCEDRIN MIGRAINE) 250-250-65 MG tablet, Take by mouth every 6 (six) hours as needed for headache., Disp: , Rfl:    dicyclomine (BENTYL) 10 MG capsule, Take 1 capsule (10 mg total) by mouth 4 (four) times daily -  before meals and at bedtime. As needed, Disp: 60 capsule, Rfl: 2   fluticasone (FLONASE) 50 MCG/ACT nasal spray, Place 2 sprays into both nostrils daily. Use for 4-6 weeks then stop and use seasonally or as needed., Disp: 16 g, Rfl: 3   furosemide (LASIX) 20 MG tablet, Take 1-2 tablets (20-40 mg total) by mouth daily as needed for fluid., Disp: 30 tablet, Rfl: 0   gabapentin (NEURONTIN) 300 MG capsule, TAKE ONE CAPSULE BY MOUTH IN THE MORNING, AT NOON, IN THE EVENING AND AT BEDTIME, Disp: 60 capsule, Rfl: 3   tiZANidine (ZANAFLEX) 4 MG tablet, Take 1 tablet (4 mg total) by mouth 3 (three) times daily., Disp: 270 tablet, Rfl: 0   traMADol (ULTRAM) 50 MG tablet, Take 1 tablet (50 mg total) by mouth every 6 (six) hours as needed for moderate pain. Max dose is 3 tablets in 24 hours., Disp: 90 tablet, Rfl: 0   traZODone (DESYREL) 100 MG tablet, Take 1 tablet (100 mg total) by mouth at bedtime., Disp: 90 tablet, Rfl: 0  Observations/Objective: Patient is well-developed, well-nourished in no acute distress.  Resting comfortably  Head is normocephalic, atraumatic.  No labored breathing.  Speech is clear and coherent with logical content.  Patient is alert and oriented at baseline.    Assessment and Plan: 1. Suspected UTI - sulfamethoxazole-trimethoprim (BACTRIM DS) 800-160 MG tablet; Take 1 tablet by mouth 2 (two) times daily.  Dispense: 10 tablet; Refill: 0 - phenazopyridine (PYRIDIUM) 100 MG tablet; Take 1 tablet (100 mg total)  by mouth 3 (three) times daily as needed for pain.  Dispense: 10 tablet; Refill: 0  - Worsening symptoms.  - Will treat empirically with Bactrim - May use Pyridium for bladder spasms - Continue to push fluids.  - Seek in person evaluation for urine culture if symptoms do not improve or if they worsen.    Follow Up Instructions: I discussed the assessment and treatment plan with the patient. The patient was provided an opportunity to ask questions and all were answered. The patient agreed with the plan and demonstrated an understanding of the instructions.  A copy of instructions were sent to the patient via MyChart unless otherwise noted below.    The patient was advised to call back or seek an in-person evaluation if the symptoms worsen or if the condition fails to improve as anticipated.  Time:  I spent 10 minutes with the patient via telehealth technology discussing the above problems/concerns.    Margaretann Loveless, PA-C

## 2023-02-22 NOTE — Patient Instructions (Signed)
Terrill Mohr, thank you for joining Margaretann Loveless, PA-C for today's virtual visit.  While this provider is not your primary care provider (PCP), if your PCP is located in our provider database this encounter information will be shared with them immediately following your visit.   A [facility] MyChart account gives you access to today's visit and all your visits, tests, and labs performed at [facility] " click here if you don't have a [facility] MyChart account or go to [email or web address]  Consent: (Patient) Veronica Lyons provided verbal consent for this virtual visit at the beginning of the encounter.  Current Medications:  Current Outpatient Medications:    phenazopyridine (PYRIDIUM) 100 MG tablet, Take 1 tablet (100 mg total) by mouth 3 (three) times daily as needed for pain., Disp: 10 tablet, Rfl: 0   sulfamethoxazole-trimethoprim (BACTRIM DS) 800-160 MG tablet, Take 1 tablet by mouth 2 (two) times daily., Disp: 10 tablet, Rfl: 0   albuterol (VENTOLIN HFA) 108 (90 Base) MCG/ACT inhaler, Inhale 1-2 puffs into the lungs every 4 (four) hours as needed for wheezing or shortness of breath., Disp: 8 g, Rfl: 2   amphetamine-dextroamphetamine (ADDERALL) 20 MG tablet, Take 1 tablet (20 mg total) by mouth 2 (two) times daily., Disp: 60 tablet, Rfl: 0   aspirin-acetaminophen-caffeine (EXCEDRIN MIGRAINE) 250-250-65 MG tablet, Take by mouth every 6 (six) hours as needed for headache., Disp: , Rfl:    dicyclomine (BENTYL) 10 MG capsule, Take 1 capsule (10 mg total) by mouth 4 (four) times daily -  before meals and at bedtime. As needed, Disp: 60 capsule, Rfl: 2   fluticasone (FLONASE) 50 MCG/ACT nasal spray, Place 2 sprays into both nostrils daily. Use for 4-6 weeks then stop and use seasonally or as needed., Disp: 16 g, Rfl: 3   furosemide (LASIX) 20 MG tablet, Take 1-2 tablets (20-40 mg total) by mouth daily as needed for fluid., Disp: 30 tablet, Rfl: 0   gabapentin  (NEURONTIN) 300 MG capsule, TAKE ONE CAPSULE BY MOUTH IN THE MORNING, AT NOON, IN THE EVENING AND AT BEDTIME, Disp: 60 capsule, Rfl: 3   tiZANidine (ZANAFLEX) 4 MG tablet, Take 1 tablet (4 mg total) by mouth 3 (three) times daily., Disp: 270 tablet, Rfl: 0   traMADol (ULTRAM) 50 MG tablet, Take 1 tablet (50 mg total) by mouth every 6 (six) hours as needed for moderate pain. Max dose is 3 tablets in 24 hours., Disp: 90 tablet, Rfl: 0   traZODone (DESYREL) 100 MG tablet, Take 1 tablet (100 mg total) by mouth at bedtime., Disp: 90 tablet, Rfl: 0   Medications ordered in this encounter:  Meds ordered this encounter  Medications   sulfamethoxazole-trimethoprim (BACTRIM DS) 800-160 MG tablet    Sig: Take 1 tablet by mouth 2 (two) times daily.    Dispense:  10 tablet    Refill:  0    Order Specific Question:   Supervising Provider    Answer:   Merrilee Jansky [id number]   phenazopyridine (PYRIDIUM) 100 MG tablet    Sig: Take 1 tablet (100 mg total) by mouth 3 (three) times daily as needed for pain.    Dispense:  10 tablet    Refill:  0    Order Specific Question:   Supervising Provider    Answer:   Merrilee Jansky [id number]     *If you need refills on other medications prior to your next appointment, please contact your pharmacy*  Follow-Up: Call back or seek an in-person  evaluation if the symptoms worsen or if the condition fails to improve as anticipated.  [facility] [phone]  Other Instructions Urinary Tract Infection, Adult  A urinary tract infection (UTI) is an infection of any part of the urinary tract. The urinary tract includes the kidneys, ureters, bladder, and urethra. These organs make, store, and get rid of urine in the body. An upper UTI affects the ureters and kidneys. A lower UTI affects the bladder and urethra. What are the causes? Most urinary tract infections are caused by bacteria in your genital area around your urethra, where urine leaves your  body. These bacteria grow and cause inflammation of your urinary tract. What increases the risk? You are more likely to develop this condition if: You have a urinary catheter that stays in place. You are not able to control when you urinate or have a bowel movement (incontinence). You are female and you: Use a spermicide or diaphragm for birth control. Have low estrogen levels. Are pregnant. You have certain genes that increase your risk. You are sexually active. You take antibiotic medicines. You have a condition that causes your flow of urine to slow down, such as: An enlarged prostate, if you are female. Blockage in your urethra. A kidney stone. A nerve condition that affects your bladder control (neurogenic bladder). Not getting enough to drink, or not urinating often. You have certain medical conditions, such as: Diabetes. A weak disease-fighting system (immunesystem). Sickle cell disease. Gout. Spinal cord injury. What are the signs or symptoms? Symptoms of this condition include: Needing to urinate right away (urgency). Frequent urination. This may include small amounts of urine each time you urinate. Pain or burning with urination. Blood in the urine. Urine that smells bad or unusual. Trouble urinating. Cloudy urine. Vaginal discharge, if you are female. Pain in the abdomen or the lower back. You may also have: Vomiting or a decreased appetite. Confusion. Irritability or tiredness. A fever or chills. Diarrhea. The first symptom in older adults may be confusion. In some cases, they may not have any symptoms until the infection has worsened. How is this diagnosed? This condition is diagnosed based on your medical history and a physical exam. You may also have other tests, including: Urine tests. Blood tests. Tests for STIs (sexually transmitted infections). If you have had more than one UTI, a cystoscopy or imaging studies may be done to determine the cause of the  infections. How is this treated? Treatment for this condition includes: Antibiotic medicine. Over-the-counter medicines to treat discomfort. Drinking enough water to stay hydrated. If you have frequent infections or have other conditions such as a kidney stone, you may need to see a health care provider who specializes in the urinary tract (urologist). In rare cases, urinary tract infections can cause sepsis. Sepsis is a life-threatening condition that occurs when the body responds to an infection. Sepsis is treated in the hospital with IV antibiotics, fluids, and other medicines. Follow these instructions at home:  Medicines Take over-the-counter and prescription medicines only as told by your health care provider. If you were prescribed an antibiotic medicine, take it as told by your health care provider. Do not stop using the antibiotic even if you start to feel better. General instructions Make sure you: Empty your bladder often and completely. Do not hold urine for long periods of time. Empty your bladder after sex. Wipe from front to back after urinating or having a bowel movement if you are female. Use each tissue  only one time when you wipe. Drink enough fluid to keep your urine pale yellow. Keep all follow-up visits. This is important. Contact a health care provider if: Your symptoms do not get better after 1-2 days. Your symptoms go away and then return. Get help right away if: You have severe pain in your back or your lower abdomen. You have a fever or chills. You have nausea or vomiting. Summary A urinary tract infection (UTI) is an infection of any part of the urinary tract, which includes the kidneys, ureters, bladder, and urethra. Most urinary tract infections are caused by bacteria in your genital area. Treatment for this condition often includes antibiotic medicines. If you were prescribed an antibiotic medicine, take it as told by your health care provider. Do not  stop using the antibiotic even if you start to feel better. Keep all follow-up visits. This is important. This information is not intended to replace advice given to you by your health care provider. Make sure you discuss any questions you have with your health care provider. Document Revised: 02/01/2020 Document Reviewed: 02/06/2020 Elsevier Patient Education  2024 [employer]    If you have been instructed to have an in-person evaluation today at a local Urgent Care facility, please use the link below. It will take you to a list of all of our available [facility], including address, phone number and hours of operation. Please do not delay care.  [facility]  If you or a family member do not have a primary care provider, use the link below to schedule a visit and establish care. When you choose a [facility] primary care physician or advanced practice provider, you gain a long-term partner in health. Find a Primary Care Provider  Learn more about [facility]'s in-office and virtual care options: [facility] - Get Care Now

## 2023-03-01 ENCOUNTER — Encounter: Payer: Self-pay | Admitting: Physician Assistant

## 2023-03-01 ENCOUNTER — Encounter (INDEPENDENT_AMBULATORY_CARE_PROVIDER_SITE_OTHER): Payer: Medicaid Other | Admitting: Family Medicine

## 2023-03-01 DIAGNOSIS — B370 Candidal stomatitis: Secondary | ICD-10-CM | POA: Diagnosis not present

## 2023-03-01 MED ORDER — FLUCONAZOLE 150 MG PO TABS: 150.0000 mg | ORAL_TABLET | Freq: Every day | ORAL | 1 refills | Status: AC

## 2023-03-01 NOTE — Telephone Encounter (Signed)
Please see the MyChart message reply(ies) for my assessment and plan.    This patient gave consent for this Medical Advice Message and is aware that it may result in a bill to their insurance company, as well as the possibility of receiving a bill for a co-payment or deductible. They are an established patient, but are not seeking medical advice exclusively about a problem treated during an in person or video visit in the last seven days. I did not recommend an in person or video visit within seven days of my reply.    I spent a total of 5 minutes cumulative time within 7 days through MyChart messaging.  Alexander Karamalegos, DO   

## 2023-05-25 ENCOUNTER — Other Ambulatory Visit: Payer: Medicaid Other

## 2023-06-01 ENCOUNTER — Ambulatory Visit: Payer: Medicaid Other | Admitting: Family Medicine
